# Patient Record
Sex: Female | Born: 1985 | Race: White | Hispanic: No | Marital: Married | Smoking: Never smoker
Health system: Southern US, Community
[De-identification: ages and names within clinical notes are randomized; demographics above are authoritative.]

## PROBLEM LIST (undated history)

## (undated) DIAGNOSIS — F32A Depression, unspecified: Secondary | ICD-10-CM

## (undated) DIAGNOSIS — R569 Unspecified convulsions: Secondary | ICD-10-CM

## (undated) DIAGNOSIS — G43909 Migraine, unspecified, not intractable, without status migrainosus: Secondary | ICD-10-CM

## (undated) DIAGNOSIS — F419 Anxiety disorder, unspecified: Secondary | ICD-10-CM

## (undated) DIAGNOSIS — E282 Polycystic ovarian syndrome: Secondary | ICD-10-CM

## (undated) DIAGNOSIS — N912 Amenorrhea, unspecified: Secondary | ICD-10-CM

## (undated) HISTORY — DX: Depression, unspecified: F32.A

## (undated) HISTORY — DX: Unspecified convulsions: R56.9

## (undated) HISTORY — DX: Migraine, unspecified, not intractable, without status migrainosus: G43.909

## (undated) HISTORY — DX: Polycystic ovarian syndrome: E28.2

## (undated) HISTORY — DX: Amenorrhea, unspecified: N91.2

## (undated) HISTORY — DX: Anxiety disorder, unspecified: F41.9

---

## 2017-04-06 ENCOUNTER — Ambulatory Visit (INDEPENDENT_AMBULATORY_CARE_PROVIDER_SITE_OTHER): Payer: 59 | Admitting: Family Medicine

## 2017-04-06 ENCOUNTER — Encounter: Payer: Self-pay | Admitting: Family Medicine

## 2017-04-06 VITALS — BP 128/85 | HR 63 | Temp 98.5°F | Ht 66.0 in | Wt 251.3 lb

## 2017-04-06 DIAGNOSIS — D489 Neoplasm of uncertain behavior, unspecified: Secondary | ICD-10-CM

## 2017-04-06 DIAGNOSIS — Z7689 Persons encountering health services in other specified circumstances: Secondary | ICD-10-CM

## 2017-04-06 DIAGNOSIS — D235 Other benign neoplasm of skin of trunk: Secondary | ICD-10-CM | POA: Diagnosis not present

## 2017-04-06 DIAGNOSIS — N912 Amenorrhea, unspecified: Secondary | ICD-10-CM | POA: Diagnosis not present

## 2017-04-06 NOTE — Progress Notes (Addendum)
BP 128/85 (BP Location: Right Arm, Patient Position: Sitting, Cuff Size: Large)   Pulse 63   Temp 98.5 F (36.9 C)   Ht 5\' 6"  (1.676 m)   Wt 251 lb 4.8 oz (114 kg)   SpO2 97%   BMI 40.56 kg/m    Subjective:    Patient ID: Ruth Bass, female    DOB: 1985-10-14, 31 y.o.   MRN: [id number]  HPI: Ruth Bass is a 31 y.o. female  Chief Complaint  Patient presents with  . Establish Care    No complaints   Patient presents today to establish care. Has several concerns. No known chronic medical conditions, and not currently on any medications.   Needs BMI exception form filled out for work. Has started counting calories and walking, down 7 lb in the past 2 months.   Has a mole on her upper back that she first noticed in middle school. No real changes noticed, bleeding, tenderness. Area is flesh toned.   Last period was over a year ago, will have one every few years. Started noticing in early 51s that they were becoming very infrequent. Periods were always irregular. No hx with birth control, no prior pregnancies. No noted abnormal hair growth, pelvic pain, dyspareunia. Has been to several doctors in her teens for this with no abnormal findings that she can remember.  Past Medical History:  Diagnosis Date  . Amenorrhea    Since child, periods severely irregular. Over time periods stopped.    Relevant past medical, surgical, family and social history reviewed and updated as indicated. Interim medical history since our last visit reviewed. Allergies and medications reviewed and updated.  Review of Systems  Constitutional: Negative.   HENT: Negative.   Respiratory: Negative.   Cardiovascular: Negative.   Gastrointestinal: Negative.   Genitourinary: Positive for menstrual problem.  Musculoskeletal: Negative.   Neurological: Negative.   Psychiatric/Behavioral: Negative.    Per HPI unless specifically indicated above     Objective:    BP 128/85 (BP Location: Right  Arm, Patient Position: Sitting, Cuff Size: Large)   Pulse 63   Temp 98.5 F (36.9 C)   Ht 5\' 6"  (1.676 m)   Wt 251 lb 4.8 oz (114 kg)   SpO2 97%   BMI 40.56 kg/m   Wt Readings from Last 3 Encounters:  04/06/17 251 lb 4.8 oz (114 kg)    Physical Exam  Constitutional: She is oriented to person, place, and time. She appears well-developed and well-nourished. No distress.  HENT:  Head: Atraumatic.  Eyes: Pupils are equal, round, and reactive to light. Conjunctivae are normal. No scleral icterus.  Neck: Normal range of motion. Neck supple.  Cardiovascular: Normal rate and normal heart sounds.   Pulmonary/Chest: Effort normal and breath sounds normal. No respiratory distress.  Musculoskeletal: Normal range of motion.  Neurological: She is alert and oriented to person, place, and time.  Skin: Skin is warm and dry.  Very small, flesh toned/white hard mass on mid upper back   Psychiatric: She has a normal mood and affect. Her behavior is normal.  Nursing note and vitals reviewed.  Procedure Note: Shave biopsy lesion upper back Procedure explained in full, pt questions answered adequately. Area was prepped with alcohol pad and infiltrated with 3 cc 1% lidocaine with epi. Dermablade was used to shave lesion, then gauze, pressure, and silver nitrate sticks used to control bleeding. Area was dressed with antibiotic ointment and bandage. Procedure well tolerated with no complications. Wound care reviewed  in detail. Return precautions discussed.   No results found for this or any previous visit.    Assessment & Plan:   Problem List Items Addressed This Visit    None    Visit Diagnoses    Encounter to establish care    -  Primary   Amenorrhea       Will refer to GYN for further workup. Offered to start OCPs to help induce/regulate periods in meantime but pt wishes to wait until work-up with GYN   Relevant Orders   Ambulatory referral to Gynecology   Neoplasm of uncertain behavior        Reassured pt that area was benign, but pt opting for bx for peace of mind. Bx performed, specimen sent for analysis. Wound care reviewed.    Relevant Orders   Pathology       Follow up plan: Return for CPE.

## 2017-04-06 NOTE — Patient Instructions (Signed)
Follow up as needed

## 2017-04-09 ENCOUNTER — Telehealth: Payer: Self-pay | Admitting: Obstetrics & Gynecology

## 2017-04-09 NOTE — Telephone Encounter (Signed)
[facility] family referred for Amenorrhea. Voicemail box not setup unable to leave message

## 2017-04-10 NOTE — Telephone Encounter (Signed)
Attempted to reach patient voicemail box not set up.

## 2017-04-16 ENCOUNTER — Telehealth: Payer: Self-pay | Admitting: Family Medicine

## 2017-04-16 LAB — PATHOLOGY

## 2017-04-16 NOTE — Telephone Encounter (Signed)
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 Nothing to worry about whatsoever!

## 2017-04-16 NOTE — Telephone Encounter (Signed)
Patient notified

## 2017-04-17 NOTE — Telephone Encounter (Signed)
Attempted to reach patient voicemail box not set up.

## 2017-04-19 NOTE — Telephone Encounter (Signed)
Attempted to reach patient voicemail box not set up.

## 2017-07-19 ENCOUNTER — Ambulatory Visit: Payer: 59 | Admitting: Family Medicine

## 2017-07-25 ENCOUNTER — Other Ambulatory Visit: Payer: Self-pay

## 2017-07-25 ENCOUNTER — Ambulatory Visit: Payer: Managed Care, Other (non HMO) | Admitting: Family Medicine

## 2017-07-25 VITALS — BP 141/88 | HR 59 | Temp 98.3°F | Resp 15 | Wt 233.0 lb

## 2017-07-25 DIAGNOSIS — R5383 Other fatigue: Secondary | ICD-10-CM

## 2017-07-25 DIAGNOSIS — M791 Myalgia, unspecified site: Secondary | ICD-10-CM | POA: Diagnosis not present

## 2017-07-25 DIAGNOSIS — L509 Urticaria, unspecified: Secondary | ICD-10-CM

## 2017-07-25 MED ORDER — DULOXETINE HCL 20 MG PO CPEP: 20.0000 mg | ORAL_CAPSULE | Freq: Every day | ORAL | 0 refills | Status: DC

## 2017-07-25 MED ORDER — TRIAMCINOLONE ACETONIDE 0.1 % EX CREA
1.0000 | TOPICAL_CREAM | Freq: Two times a day (BID) | CUTANEOUS | 0 refills | Status: DC
Start: 2017-07-25 — End: 2017-09-19

## 2017-07-25 MED ORDER — CYCLOBENZAPRINE HCL 10 MG PO TABS: 10.0000 mg | ORAL_TABLET | Freq: Three times a day (TID) | ORAL | 0 refills | Status: DC | PRN

## 2017-07-25 NOTE — Progress Notes (Signed)
BP (!) 141/88 (BP Location: Right Arm, Patient Position: Sitting)   Pulse (!) 59   Temp 98.3 F (36.8 C) (Oral)   Resp 15   Wt 233 lb (105.7 kg)   SpO2 98%   BMI 37.61 kg/m    Subjective:    Patient ID: Ruth Bass, female    DOB: 08-Mar-1986, 32 y.o.   MRN: [id number]  HPI: Ruth Bass is a 32 y.o. female  Chief Complaint  Patient presents with  . Fatigue  . Muscle Pain    bilateral arms and legs   . Anxiety  . Altered Mental Status   Several years of muscle aches in extremities, comes and goes throughout the year. Current flare has been several weeks now. Also having pains less often in sides around ribs. Sometimes having hand weakness associated. Has mental fog, dysphoria, and anxiety associated with it. Has family members with fibromyalgia and states this seems similar to what they are dealing with. Denies hx of trauma or injuries to these areas. Has not been trying anything OTC other than occasional OTC pain relievers with some relief.   Itchy rash coming and going on left forearm the past few weeks. No new exposures, recent travel, pets in the home. Has not tried anything for relief.   Past Medical History:  Diagnosis Date  . Amenorrhea    Since child, periods severely irregular. Over time periods stopped.   Social History   Socioeconomic History  . Marital status: Significant Other    Spouse name: Not on file  . Number of children: Not on file  . Years of education: Not on file  . Highest education level: Not on file  Social Needs  . Financial resource strain: Not on file  . Food insecurity - worry: Not on file  . Food insecurity - inability: Not on file  . Transportation needs - medical: Not on file  . Transportation needs - non-medical: Not on file  Occupational History  . Not on file  Tobacco Use  . Smoking status: Never Smoker  . Smokeless tobacco: Never Used  Substance and Sexual Activity  . Alcohol use: No  . Drug use: No  . Sexual  activity: Not on file  Other Topics Concern  . Not on file  Social History Narrative  . Not on file   Relevant past medical, surgical, family and social history reviewed and updated as indicated. Interim medical history since our last visit reviewed. Allergies and medications reviewed and updated.  Review of Systems  HENT: Negative.   Eyes: Negative.   Respiratory: Negative.   Cardiovascular: Negative.   Gastrointestinal: Negative.   Genitourinary: Negative.   Musculoskeletal: Positive for myalgias.  Skin: Positive for rash.  Neurological: Positive for weakness.  Psychiatric/Behavioral: Positive for dysphoric mood. The patient is nervous/anxious.        "brain fog"   Per HPI unless specifically indicated above     Objective:    BP (!) 141/88 (BP Location: Right Arm, Patient Position: Sitting)   Pulse (!) 59   Temp 98.3 F (36.8 C) (Oral)   Resp 15   Wt 233 lb (105.7 kg)   SpO2 98%   BMI 37.61 kg/m   Wt Readings from Last 3 Encounters:  07/25/17 233 lb (105.7 kg)  04/06/17 251 lb 4.8 oz (114 kg)    Physical Exam  Constitutional: She is oriented to person, place, and time. She appears well-developed and well-nourished. No distress.  HENT:  Head: Atraumatic.  Eyes: Conjunctivae are normal. Pupils are equal, round, and reactive to light.  Neck: Normal range of motion. Neck supple.  Cardiovascular: Normal rate, normal heart sounds and intact distal pulses.  Pulmonary/Chest: Effort normal and breath sounds normal. No respiratory distress.  Musculoskeletal: Normal range of motion. She exhibits tenderness (Mild ttp over deltoid b/l). She exhibits no edema or deformity.  Grip strength equal b/l hands  Neurological: She is alert and oriented to person, place, and time. No cranial nerve deficit. She exhibits normal muscle tone. Coordination normal.  Skin: Skin is warm and dry. Rash (Mild urticarial patch left forearm) noted.  Psychiatric: She has a normal mood and affect. Her  behavior is normal.  Nursing note and vitals reviewed.  Results for orders placed or performed in visit on 07/25/17  VITAMIN D 25 Hydroxy (Vit-D Deficiency, Fractures)  Result Value Ref Range   Vit D, 25-Hydroxy 24.2 (L) 30.0 - 100.0 ng/mL  Vitamin B12  Result Value Ref Range   Vitamin B-12 425 232 - 1,245 pg/mL      Assessment & Plan:   Problem List Items Addressed This Visit    None    Visit Diagnoses    Fatigue, unspecified type    -  Primary   Relevant Orders   VITAMIN D 25 Hydroxy (Vit-D Deficiency, Fractures) (Completed)   Vitamin B12 (Completed)   Generalized muscle ache       Sxs consistent with fibromyalgia, will trial cymbalta to target moods and pain sxs. Risks and benefits reviewed. Epsom soaks, muscle rubs, tylenol prn   Urticaria       Benadryl and tirmacinolone cream prn. Good moisturizing regimen recommended       Follow up plan: Return in about 4 weeks (around 08/22/2017) for Medication f/u.

## 2017-07-25 NOTE — Patient Instructions (Signed)
[facility] [phone]

## 2017-07-26 LAB — VITAMIN B12: Vitamin B-12: 425 pg/mL (ref 232–1245)

## 2017-07-26 LAB — VITAMIN D 25 HYDROXY (VIT D DEFICIENCY, FRACTURES): Vit D, 25-Hydroxy: 24.2 ng/mL — ABNORMAL LOW (ref 30.0–100.0)

## 2017-07-27 ENCOUNTER — Telehealth: Payer: Self-pay | Admitting: Family Medicine

## 2017-07-27 NOTE — Telephone Encounter (Signed)
Spoke with patient, recommended OTC vitamin D and increasing dietary intake. Pt wanting to know if a certain brand was best, discussed that any reputable brand is fine. Pt denies any further questions

## 2017-07-27 NOTE — Telephone Encounter (Signed)
Copied from [place] [id number]. Topic: Quick Communication - See Telephone Encounter >> Jul 27, 2017  2:35 PM Arletha Grippe wrote: CRM for notification. See Telephone encounter for:   07/27/17. Pt received labs on mychart about her vit d and has questions. Please call [phone]

## 2017-07-28 ENCOUNTER — Encounter: Payer: Self-pay | Admitting: Family Medicine

## 2017-08-01 ENCOUNTER — Encounter: Payer: Self-pay | Admitting: Family Medicine

## 2017-08-10 ENCOUNTER — Encounter: Payer: Self-pay | Admitting: Family Medicine

## 2017-08-13 ENCOUNTER — Other Ambulatory Visit: Payer: Self-pay | Admitting: Family Medicine

## 2017-08-13 DIAGNOSIS — N912 Amenorrhea, unspecified: Secondary | ICD-10-CM

## 2017-08-16 ENCOUNTER — Encounter: Payer: Self-pay | Admitting: Obstetrics and Gynecology

## 2017-08-24 ENCOUNTER — Other Ambulatory Visit: Payer: Self-pay | Admitting: Obstetrics & Gynecology

## 2017-08-24 ENCOUNTER — Ambulatory Visit: Payer: Managed Care, Other (non HMO) | Admitting: Obstetrics & Gynecology

## 2017-08-24 ENCOUNTER — Encounter: Payer: Self-pay | Admitting: Obstetrics & Gynecology

## 2017-08-24 ENCOUNTER — Ambulatory Visit: Payer: Managed Care, Other (non HMO) | Admitting: Family Medicine

## 2017-08-24 ENCOUNTER — Encounter: Payer: Self-pay | Admitting: Family Medicine

## 2017-08-24 VITALS — BP 125/83 | HR 73 | Temp 97.6°F | Wt 227.6 lb

## 2017-08-24 VITALS — BP 140/90 | HR 77 | Ht 66.0 in | Wt 226.0 lb

## 2017-08-24 DIAGNOSIS — F339 Major depressive disorder, recurrent, unspecified: Secondary | ICD-10-CM | POA: Insufficient documentation

## 2017-08-24 DIAGNOSIS — M791 Myalgia, unspecified site: Secondary | ICD-10-CM | POA: Diagnosis not present

## 2017-08-24 DIAGNOSIS — Z124 Encounter for screening for malignant neoplasm of cervix: Secondary | ICD-10-CM | POA: Diagnosis not present

## 2017-08-24 DIAGNOSIS — N912 Amenorrhea, unspecified: Secondary | ICD-10-CM

## 2017-08-24 MED ORDER — MEDROXYPROGESTERONE ACETATE 10 MG PO TABS: ORAL_TABLET | ORAL | 0 refills | Status: DC

## 2017-08-24 MED ORDER — DULOXETINE HCL 30 MG PO CPEP: 30.0000 mg | ORAL_CAPSULE | Freq: Every day | ORAL | 2 refills | Status: DC

## 2017-08-24 NOTE — Assessment & Plan Note (Signed)
Good preliminary improvement on Cymbalta, will increase to 30 mg and monitor closely for benefit.

## 2017-08-24 NOTE — Patient Instructions (Signed)
Medroxyprogesterone tablets What is this medicine? MEDROXYPROGESTERONE (me DROX ee proe JES te rone) is a hormone in a class called progestins. It is commonly used to prevent the uterine lining from overgrowth in women taking an estrogen after menopause. It is also used to treat irregular menstrual bleeding or a lack of menstrual bleeding in women. This medicine may be used for other purposes; ask your health care provider or pharmacist if you have questions. COMMON BRAND NAME(S): Amen, Provera What should I tell my health care provider before I take this medicine? They need to know if you have any of these conditions: -blood vessel disease or a history of a blood clot in the lungs or legs -breast, cervical or vaginal cancer -heart disease -kidney disease -liver disease -migraine -recent miscarriage or abortion -mental depression -migraine -seizures (convulsions) -stroke -vaginal bleeding that has not been evaluated -an unusual or allergic reaction to medroxyprogesterone, other medicines, foods, dyes, or preservatives -pregnant or trying to get pregnant -breast-feeding How should I use this medicine? Take this medicine by mouth with a glass of water. Follow the directions on the prescription label. Take your doses at regular intervals. Do not take your medicine more often than directed. Talk to your pediatrician regarding the use of this medicine in children. Special care may be needed. While this drug may be prescribed for children as young as 13 years for selected conditions, precautions do apply. Overdosage: If you think you have taken too much of this medicine contact a poison control center or emergency room at once. NOTE: This medicine is only for you. Do not share this medicine with others. What if I miss a dose? If you miss a dose, take it as soon as you can. If it is almost time for your next dose, take only that dose. Do not take double or extra doses. What may interact with  this medicine? -barbiturate medicines for inducing sleep or treating seizures (convulsions) -bosentan -carbamazepine -phenytoin -rifampin -St. John's Wort This list may not describe all possible interactions. Give your health care provider a list of all the medicines, herbs, non-prescription drugs, or dietary supplements you use. Also tell them if you smoke, drink alcohol, or use illegal drugs. Some items may interact with your medicine. What should I watch for while using this medicine? Visit your health care professional for regular checks on your progress. You will need a regular breast and pelvic exam. If you have any reason to think you are pregnant, stop taking this medicine at once and contact your doctor or health care professional. What side effects may I notice from receiving this medicine? Side effects that you should report to your doctor or health care professional as soon as possible: -breast tenderness or discharge -changes in mood or emotions, such as depression -changes in vision or speech -pain in the abdomen, chest, groin, or leg -severe headache -skin rash, itching, or hives -sudden shortness of breath -unusually weak or tired -yellowing of skin or eyes Side effects that usually do not require medical attention (report to your doctor or health care professional if they continue or are bothersome): -acne -change in menstrual bleeding pattern or flow -changes in sexual desire -facial hair growth -fluid retention and swelling -headache -upset stomach -weight gain or loss This list may not describe all possible side effects. Call your doctor for medical advice about side effects. You may report side effects to FDA at 1-800-FDA-1088. Where should I keep my medicine? Keep out of the reach of children.  Store at room temperature between 20 and 25 degrees C (68 and 77 degrees F). Throw away any unused medicine after the expiration date. NOTE: This sheet is a summary. It  may not cover all possible information. If you have questions about this medicine, talk to your doctor, pharmacist, or health care provider.  2018 Elsevier/Gold Standard (2008-06-25 11:26:12)

## 2017-08-24 NOTE — Progress Notes (Signed)
Amenorrhea Patient presents for evaluation of amenorrhea. Currently periods are occurring every absent since 05/2016 and prior to that were 2 times per year really since menarche age 32. Bleeding is none now.  Is there a chance of pregnancy? She is sexually active without birth control for 2 years.  Factors that may be contributory to menstrual abnormalities include: hormonal factors as always has had oligomenorrhea, concern for PCOS. Previous treatments for menstrual abnormalities include: never took OCPs or BC.  PMHx: She  has a past medical history of Amenorrhea. Also,  has no past surgical history on file., family history includes Cancer in her maternal grandmother and paternal aunt; Diabetes in her father and paternal uncle; Heart disease in her father and paternal aunt; Hypertension in her mother; Irritable bowel syndrome in her cousin; Mental illness in her sister; Pancreatitis in her father; Stroke in her maternal grandfather and maternal grandmother.,  reports that  has never smoked. she has never used smokeless tobacco. She reports that she does not drink alcohol or use drugs.  She has a current medication list which includes the following prescription(s): cyclobenzaprine, duloxetine, famotidine, multivitamin, acetaminophen, and triamcinolone cream. Also, has No Known Allergies.  Review of Systems  Constitutional: Negative for chills, fever and malaise/fatigue.  HENT: Negative for congestion, sinus pain and sore throat.   Eyes: Negative for blurred vision and pain.  Respiratory: Negative for cough and wheezing.   Cardiovascular: Negative for chest pain and leg swelling.  Gastrointestinal: Negative for abdominal pain, constipation, diarrhea, heartburn, nausea and vomiting.  Genitourinary: Negative for dysuria, frequency, hematuria and urgency.  Musculoskeletal: Negative for back pain, joint pain, myalgias and neck pain.  Skin: Negative for itching and rash.  Neurological: Negative for  dizziness, tremors and weakness.  Endo/Heme/Allergies: Does not bruise/bleed easily.  Psychiatric/Behavioral: Negative for depression. The patient is not nervous/anxious and does not have insomnia.     Objective: BP 140/90   Pulse 77   Ht 5\' 6"  (1.676 m)   Wt 226 lb (102.5 kg)   BMI 36.48 kg/m  Physical Exam  Constitutional: She is oriented to person, place, and time. She appears well-developed and well-nourished. No distress.  Genitourinary: Rectum normal, vagina normal and uterus normal. Pelvic exam was performed with patient supine. There is no rash or lesion on the right labia. There is no rash or lesion on the left labia. Vagina exhibits no lesion. No bleeding in the vagina. Right adnexum does not display mass and does not display tenderness. Left adnexum does not display mass and does not display tenderness. Cervix does not exhibit motion tenderness, lesion, friability or polyp.   Uterus is mobile and midaxial. Uterus is not enlarged or exhibiting a mass.  HENT:  Head: Normocephalic and atraumatic. Head is without laceration.  Right Ear: Hearing normal.  Left Ear: Hearing normal.  Nose: No epistaxis.  No foreign bodies.  Mouth/Throat: Uvula is midline, oropharynx is clear and moist and mucous membranes are normal.  Eyes: Pupils are equal, round, and reactive to light.  Neck: Normal range of motion. Neck supple. No thyromegaly present.  Cardiovascular: Normal rate and regular rhythm. Exam reveals no gallop and no friction rub.  No murmur heard. Pulmonary/Chest: Effort normal and breath sounds normal. No respiratory distress. She has no wheezes. Right breast exhibits no mass, no skin change and no tenderness. Left breast exhibits no mass, no skin change and no tenderness.  Abdominal: Soft. Bowel sounds are normal. She exhibits no distension. There is no tenderness. There is  no rebound.  Musculoskeletal: Normal range of motion.  Neurological: She is alert and oriented to person,  place, and time. No cranial nerve deficit.  Skin: Skin is warm and dry.  Psychiatric: She has a normal mood and affect. Judgment normal.  Vitals reviewed.   ASSESSMENT/PLAN:   Problem List Items Addressed This Visit    None    Visit Diagnoses    Amenorrhea    -  Primary   Relevant Orders   FSH/LH   DHEA-sulfate   Prolactin   Testosterone, Free, Total, SHBG   TSH   Estradiol   Beta HCG, Quant   Screening for cervical cancer       Relevant Orders   IGP, Aptima HPV    Provera challenge today as well Pt desires OCP for BC as well as mgt for likely PCOS; will see labs first before prescribing.  Will call w results.  PAP today as has never had one  Barnett Applebaum, MD, [facility], [facility] 08/24/2017  11:16 AM

## 2017-08-24 NOTE — Progress Notes (Signed)
   BP 125/83 (BP Location: Left Arm, Patient Position: Sitting, Cuff Size: Normal)   Pulse 73   Temp 97.6 F (36.4 C) (Oral)   Wt 227 lb 9.6 oz (103.2 kg)   SpO2 98%   BMI 36.74 kg/m    Subjective:    Patient ID: Ruth Bass, female    DOB: 01-May-1986, 32 y.o.   MRN: [id number]  HPI: Ruth Bass is a 32 y.o. female  Chief Complaint  Patient presents with  . Depression    1 month Medication F/U. Patient states cymbalta has helped some, but she understood it could take a month or so to fully get in her system.  . Nerve Pain  . Medication Management   Pt here today for mood and myalgia f/u. Doing very well on cymbalta after 1 month. Noticing definite benefit with her nerve pains/myalgias and starting to note some effect with her moods and fatigue. Denies any side effects with the medication. Denies SI/HI, hallucinations, manic behaviors. PHQ-9 score today of 8.   No flowsheet data found.  Relevant past medical, surgical, family and social history reviewed and updated as indicated. Interim medical history since our last visit reviewed. Allergies and medications reviewed and updated.  Review of Systems  Per HPI unless specifically indicated above     Objective:    BP 125/83 (BP Location: Left Arm, Patient Position: Sitting, Cuff Size: Normal)   Pulse 73   Temp 97.6 F (36.4 C) (Oral)   Wt 227 lb 9.6 oz (103.2 kg)   SpO2 98%   BMI 36.74 kg/m   Wt Readings from Last 3 Encounters:  08/24/17 227 lb 9.6 oz (103.2 kg)  08/24/17 226 lb (102.5 kg)  07/25/17 233 lb (105.7 kg)    Physical Exam  Constitutional: She is oriented to person, place, and time. She appears well-developed and well-nourished. No distress.  HENT:  Head: Atraumatic.  Eyes: Conjunctivae are normal. Pupils are equal, round, and reactive to light. No scleral icterus.  Neck: Normal range of motion. Neck supple.  Cardiovascular: Normal rate, regular rhythm and normal heart sounds.  Pulmonary/Chest:  Effort normal and breath sounds normal. No respiratory distress.  Musculoskeletal: Normal range of motion.  Neurological: She is alert and oriented to person, place, and time. No cranial nerve deficit.  Skin: Skin is warm and dry.  Psychiatric: She has a normal mood and affect. Her behavior is normal. Thought content normal.  Nursing note and vitals reviewed.   Results for orders placed or performed in visit on 07/25/17  VITAMIN D 25 Hydroxy (Vit-D Deficiency, Fractures)  Result Value Ref Range   Vit D, 25-Hydroxy 24.2 (L) 30.0 - 100.0 ng/mL  Vitamin B12  Result Value Ref Range   Vitamin B-12 425 232 - 1,245 pg/mL      Assessment & Plan:   Problem List Items Addressed This Visit      Other   Depression, recurrent (Ruth Bass) - Primary    Good preliminary improvement on Cymbalta, will increase to 30 mg and monitor closely for benefit.       Relevant Medications   DULoxetine (CYMBALTA) 30 MG capsule    Other Visit Diagnoses    Generalized muscle ache       Noting significant benefit from cymablta. Will increase dose and continue to monitor.        Follow up plan: Return in about 3 months (around 11/21/2017) for Mood f/u.

## 2017-08-26 LAB — TESTOSTERONE, FREE, TOTAL, SHBG
Sex Hormone Binding: 23.3 nmol/L — ABNORMAL LOW (ref 24.6–122.0)
TESTOSTERONE: 70 ng/dL — AB (ref 8–48)
Testosterone, Free: 7.9 pg/mL — ABNORMAL HIGH (ref 0.0–4.2)

## 2017-08-26 LAB — ESTRADIOL: ESTRADIOL: 33.6 pg/mL

## 2017-08-26 LAB — TSH: TSH: 3.21 u[IU]/mL (ref 0.450–4.500)

## 2017-08-26 LAB — DHEA-SULFATE: DHEA-SO4: 397.9 ug/dL — ABNORMAL HIGH (ref 84.8–378.0)

## 2017-08-26 LAB — FSH/LH
FSH: 6 m[IU]/mL
LH: 12.7 m[IU]/mL

## 2017-08-26 LAB — BETA HCG QUANT (REF LAB): hCG Quant: <1 m[IU]/mL

## 2017-08-26 LAB — PROLACTIN: PROLACTIN: 7.5 ng/mL (ref 4.8–23.3)

## 2017-08-27 NOTE — Patient Instructions (Signed)
Follow up in 3 months

## 2017-08-29 LAB — IGP, APTIMA HPV
HPV APTIMA: NEGATIVE
PAP Smear Comment: 0

## 2017-08-30 ENCOUNTER — Encounter: Payer: Self-pay | Admitting: Obstetrics & Gynecology

## 2017-08-30 NOTE — Progress Notes (Signed)
I will call on Feb 27 to see how Provera challenge went.  Labs c/w PCOS.  OCP a good option for now.  Will discuss and Rx at that time if agreeable.

## 2017-09-07 ENCOUNTER — Telehealth: Payer: Self-pay

## 2017-09-07 NOTE — Telephone Encounter (Signed)
Pt received vm from [facility] to update him on medication he gave her to induce a period.  Pt states she started her period yesterday.  [phone]

## 2017-09-11 ENCOUNTER — Other Ambulatory Visit: Payer: Self-pay | Admitting: Obstetrics & Gynecology

## 2017-09-11 MED ORDER — NORETHIN ACE-ETH ESTRAD-FE 1-20 MG-MCG PO TABS
1.0000 | ORAL_TABLET | Freq: Every day | ORAL | 11 refills | Status: DC
Start: 2017-09-11 — End: 2017-09-12

## 2017-09-12 ENCOUNTER — Encounter: Payer: Self-pay | Admitting: Obstetrics & Gynecology

## 2017-09-12 ENCOUNTER — Other Ambulatory Visit: Payer: Self-pay

## 2017-09-12 MED ORDER — NORETHIN ACE-ETH ESTRAD-FE 1-20 MG-MCG PO TABS: 1.0000 | ORAL_TABLET | Freq: Every day | ORAL | 11 refills | Status: DC

## 2017-09-12 NOTE — Telephone Encounter (Signed)
Did you ever speak with pt about her period? Or is there anything I can advise her to do?

## 2017-09-12 NOTE — Telephone Encounter (Signed)
Yes, I spoke to her. She is on OCP now

## 2017-09-19 ENCOUNTER — Encounter: Payer: Self-pay | Admitting: Family Medicine

## 2017-09-19 ENCOUNTER — Ambulatory Visit (INDEPENDENT_AMBULATORY_CARE_PROVIDER_SITE_OTHER): Payer: Managed Care, Other (non HMO) | Admitting: Family Medicine

## 2017-09-19 VITALS — BP 134/85 | HR 80 | Temp 98.5°F | Ht 65.5 in | Wt 222.6 lb

## 2017-09-19 DIAGNOSIS — Z113 Encounter for screening for infections with a predominantly sexual mode of transmission: Secondary | ICD-10-CM

## 2017-09-19 DIAGNOSIS — Z0000 Encounter for general adult medical examination without abnormal findings: Secondary | ICD-10-CM | POA: Diagnosis not present

## 2017-09-19 DIAGNOSIS — Z23 Encounter for immunization: Secondary | ICD-10-CM | POA: Diagnosis not present

## 2017-09-19 DIAGNOSIS — Z114 Encounter for screening for human immunodeficiency virus [HIV]: Secondary | ICD-10-CM | POA: Diagnosis not present

## 2017-09-19 LAB — UA/M W/RFLX CULTURE, ROUTINE
BILIRUBIN UA: NEGATIVE
Leukocytes, UA: NEGATIVE
Nitrite, UA: NEGATIVE
PH UA: 6 (ref 5.0–7.5)
RBC UA: NEGATIVE
Specific Gravity, UA: >1.03 — ABNORMAL HIGH (ref 1.005–1.030)
UUROB: 1 mg/dL (ref 0.2–1.0)

## 2017-09-19 LAB — MICROSCOPIC EXAMINATION: Bacteria, UA: NONE SEEN

## 2017-09-19 NOTE — Patient Instructions (Addendum)
Td Vaccine (Tetanus and Diphtheria): What You Need to Know 1. Why get vaccinated? Tetanus  and diphtheria are very serious diseases. They are rare in the [place] today, but people who do become infected often have severe complications. Td vaccine is used to protect adolescents and adults from both of these diseases. Both tetanus and diphtheria are infections caused by bacteria. Diphtheria spreads from person to person through coughing or sneezing. Tetanus-causing bacteria enter the body through cuts, scratches, or wounds. TETANUS (lockjaw) causes painful muscle tightening and stiffness, usually all over the body.  It can lead to tightening of muscles in the head and neck so you can't open your mouth, swallow, or sometimes even breathe. Tetanus kills about 1 out of every 10 people who are infected even after receiving the best medical care.  DIPHTHERIA can cause a thick coating to form in the back of the throat.  It can lead to breathing problems, paralysis, heart failure, and death.  Before vaccines, as many as 200,000 cases of diphtheria and hundreds of cases of tetanus were reported in the [place] each year. Since vaccination began, reports of cases for both diseases have dropped by about 99%. 2. Td vaccine Td vaccine can protect adolescents and adults from tetanus and diphtheria. Td is usually given as a booster dose every 10 years but it can also be given earlier after a severe and dirty wound or burn. Another vaccine, called Tdap, which protects against pertussis in addition to tetanus and diphtheria, is sometimes recommended instead of Td vaccine. Your doctor or the person giving you the vaccine can give you more information. Td may safely be given at the same time as other vaccines. 3. Some people should not get this vaccine  A person who has ever had a life-threatening allergic reaction after a previous dose of any tetanus or diphtheria containing vaccine, OR has a severe  allergy to any part of this vaccine, should not get Td vaccine. Tell the person giving the vaccine about any severe allergies.  Talk to your doctor if you: ? had severe pain or swelling after any vaccine containing diphtheria or tetanus, ? ever had a condition called Guillain Barre Syndrome (GBS), ? aren't feeling well on the day the shot is scheduled. 4. What are the risks from Td vaccine? With any medicine, including vaccines, there is a chance of side effects. These are usually mild and go away on their own. Serious reactions are also possible but are rare. Most people who get Td vaccine do not have any problems with it. Mild problems following Td vaccine: (Did not interfere with activities)  Pain where the shot was given (about 8 people in 10)  Redness or swelling where the shot was given (about 1 person in 4)  Mild fever (rare)  Headache (about 1 person in 4)  Tiredness (about 1 person in 4)  Moderate problems following Td vaccine: (Interfered with activities, but did not require medical attention)  Fever over 102F (rare)  Severe problems following Td vaccine: (Unable to perform usual activities; required medical attention)  Swelling, severe pain, bleeding and/or redness in the arm where the shot was given (rare).  Problems that could happen after any vaccine:  People sometimes faint after a medical procedure, including vaccination. Sitting or lying down for about 15 minutes can help prevent fainting, and injuries caused by a fall. Tell your doctor if you feel dizzy, or have vision changes or ringing in the ears.  Some people get   severe pain in the shoulder and have difficulty moving the arm where a shot was given. This happens very rarely.  Any medication can cause a severe allergic reaction. Such reactions from a vaccine are very rare, estimated at fewer than 1 in a million doses, and would happen within a few minutes to a few hours after the vaccination. As with any  medicine, there is a very remote chance of a vaccine causing a serious injury or death. The safety of vaccines is always being monitored. For more information, visit: www.cdc.gov/vaccinesafety/ 5. What if there is a serious reaction? What should I look for? Look for anything that concerns you, such as signs of a severe allergic reaction, very high fever, or unusual behavior. Signs of a severe allergic reaction can include hives, swelling of the face and throat, difficulty breathing, a fast heartbeat, dizziness, and weakness. These would usually start a few minutes to a few hours after the vaccination. What should I do?  If you think it is a severe allergic reaction or other emergency that can't wait, call 9-1-1 or get the person to the nearest hospital. Otherwise, call your doctor.  Afterward, the reaction should be reported to the Vaccine Adverse Event Reporting System (VAERS). Your doctor might file this report, or you can do it yourself through the VAERS web site at www.vaers.hhs.gov, or by calling 1-800-822-7967. ? VAERS does not give medical advice. 6. The National Vaccine Injury Compensation Program The National Vaccine Injury Compensation Program (VICP) is a federal program that was created to compensate people who may have been injured by certain vaccines. Persons who believe they may have been injured by a vaccine can learn about the program and about filing a claim by calling 1-800-338-2382 or visiting the VICP website at www.hrsa.gov/vaccinecompensation. There is a time limit to file a claim for compensation. 7. How can I learn more?  Ask your doctor. He or she can give you the vaccine package insert or suggest other sources of information.  Call your local or state health department.  Contact the Centers for Disease Control and Prevention (CDC): ? Call 1-800-232-4636 (1-800-CDC-INFO) ? Visit CDC's website at www.cdc.gov/vaccines CDC Td Vaccine VIS (10/19/15) This information is  not intended to replace advice given to you by your health care provider. Make sure you discuss any questions you have with your health care provider. Document Released: 04/23/2006 Document Revised: 03/16/2016 Document Reviewed: 03/16/2016 Elsevier Interactive Patient Education  2017 [employer] Inc.  

## 2017-09-19 NOTE — Progress Notes (Signed)
BP 134/85 (BP Location: Left Arm, Patient Position: Sitting, Cuff Size: Normal)   Pulse 80   Temp 98.5 F (36.9 C) (Oral)   Ht 5' 5.5" (1.664 m)   Wt 222 lb 9.6 oz (101 kg)   SpO2 97%   BMI 36.48 kg/m    Subjective:    Patient ID: Ruth Bass, female    DOB: 08-19-1985, 32 y.o.   MRN: [id number]  HPI: Ruth Bass is a 32 y.o. female presenting on 09/19/2017 for comprehensive medical examination. Current medical complaints include:none  Fasting for labs today.   Depression Screen done today and results listed below:  Depression screen [facility] 2/9 09/19/2017  Decreased Interest 2  Down, Depressed, Hopeless 1  PHQ - 2 Score 3  Altered sleeping 3  Tired, decreased energy 3  Change in appetite 1  Feeling bad or failure about yourself  0  Trouble concentrating 1  Moving slowly or fidgety/restless 1  Suicidal thoughts 0  PHQ-9 Score 12    The patient does not have a history of falls. I did not complete a risk assessment for falls. A plan of care for falls was not documented.   Past Medical History:  Past Medical History:  Diagnosis Date  . Amenorrhea    Since child, periods severely irregular. Over time periods stopped.    Surgical History:  No past surgical history on file.  Medications:  Current Outpatient Medications on File Prior to Visit  Medication Sig  . acetaminophen (TYLENOL) 325 MG tablet Take 650 mg by mouth every 6 (six) hours as needed.  . cyclobenzaprine (FLEXERIL) 10 MG tablet Take 1 tablet (10 mg total) by mouth 3 (three) times daily as needed for muscle spasms.  . DULoxetine (CYMBALTA) 30 MG capsule Take 1 capsule (30 mg total) by mouth daily.  . famotidine (PEPCID) 10 MG tablet Take 10 mg by mouth 2 (two) times daily.  . Multiple Vitamin (MULTIVITAMIN) capsule Take 1 capsule by mouth daily.  . norethindrone-ethinyl estradiol (JUNEL FE 1/20) 1-20 MG-MCG tablet Take 1 tablet by mouth daily.   No current facility-administered medications on file  prior to visit.     Allergies:  No Known Allergies  Social History:  Social History   Socioeconomic History  . Marital status: Significant Other    Spouse name: Not on file  . Number of children: Not on file  . Years of education: Not on file  . Highest education level: Not on file  Social Needs  . Financial resource strain: Not on file  . Food insecurity - worry: Not on file  . Food insecurity - inability: Not on file  . Transportation needs - medical: Not on file  . Transportation needs - non-medical: Not on file  Occupational History  . Not on file  Tobacco Use  . Smoking status: Never Smoker  . Smokeless tobacco: Never Used  Substance and Sexual Activity  . Alcohol use: No  . Drug use: No  . Sexual activity: Yes    Birth control/protection: None  Other Topics Concern  . Not on file  Social History Narrative  . Not on file   Social History   Tobacco Use  Smoking Status Never Smoker  Smokeless Tobacco Never Used   Social History   Substance and Sexual Activity  Alcohol Use No    Family History:  Family History  Problem Relation Age of Onset  . Hypertension Mother   . Diabetes Father   . Heart disease Father   .  Pancreatitis Father   . Mental illness Sister   . Heart disease Paternal Aunt   . Cancer Paternal Aunt        Breast  . Diabetes Paternal Uncle   . Cancer Maternal Grandmother        Brain Tumor and Colon Cancer  . Stroke Maternal Grandmother   . Stroke Maternal Grandfather   . Irritable bowel syndrome Cousin     Past medical history, surgical history, medications, allergies, family history and social history reviewed with patient today and changes made to appropriate areas of the chart.   Review of Systems - General ROS: negative Psychological ROS: negative Ophthalmic ROS: negative ENT ROS: negative Allergy and Immunology ROS: negative Breast ROS: negative for breast lumps Respiratory ROS: no cough, shortness of breath, or  wheezing Cardiovascular ROS: no chest pain or dyspnea on exertion Gastrointestinal ROS: no abdominal pain, change in bowel habits, or black or bloody stools Genito-Urinary ROS: no dysuria, trouble voiding, or hematuria Musculoskeletal ROS: negative Neurological ROS: no TIA or stroke symptoms Dermatological ROS: negative All other ROS negative except what is listed above and in the HPI.      Objective:    BP 134/85 (BP Location: Left Arm, Patient Position: Sitting, Cuff Size: Normal)   Pulse 80   Temp 98.5 F (36.9 C) (Oral)   Ht 5' 5.5" (1.664 m)   Wt 222 lb 9.6 oz (101 kg)   SpO2 97%   BMI 36.48 kg/m   Wt Readings from Last 3 Encounters:  09/19/17 222 lb 9.6 oz (101 kg)  08/24/17 227 lb 9.6 oz (103.2 kg)  08/24/17 226 lb (102.5 kg)    Physical Exam  Constitutional: She is oriented to person, place, and time. She appears well-developed and well-nourished. No distress.  HENT:  Head: Atraumatic.  Right Ear: External ear normal.  Left Ear: External ear normal.  Nose: Nose normal.  Mouth/Throat: Oropharynx is clear and moist. No oropharyngeal exudate.  Eyes: Conjunctivae are normal. Pupils are equal, round, and reactive to light. No scleral icterus.  Neck: Normal range of motion. Neck supple. No thyromegaly present.  Cardiovascular: Normal rate, regular rhythm, normal heart sounds and intact distal pulses.  Pulmonary/Chest: Effort normal and breath sounds normal. No respiratory distress. Right breast exhibits no nipple discharge, no skin change and no tenderness. Left breast exhibits no nipple discharge, no skin change and no tenderness. Breasts are symmetrical.  Diffuse fibrocystic tissue palpable b/l breasts, symmetric, no focal abnormalities  Abdominal: Soft. Bowel sounds are normal. She exhibits no mass. There is no tenderness.  Musculoskeletal: Normal range of motion. She exhibits no edema or tenderness.  Lymphadenopathy:    She has no cervical adenopathy.  Neurological:  She is alert and oriented to person, place, and time. No cranial nerve deficit.  Skin: Skin is warm and dry. No rash noted.  Psychiatric: She has a normal mood and affect. Her behavior is normal.  Nursing note and vitals reviewed.   Results for orders placed or performed in visit on 09/19/17  GC/Chlamydia Probe Amp  Result Value Ref Range   Chlamydia trachomatis, NAA Negative Negative   Neisseria gonorrhoeae by PCR Negative Negative  Microscopic Examination  Result Value Ref Range   WBC, UA 0-5 0 - 5 /hpf   RBC, UA 0-2 0 - 2 /hpf   Epithelial Cells (non renal) 0-10 0 - 10 /hpf   Casts Present None seen /lpf   Cast Type Hyaline casts N/A   Bacteria, UA None  seen None seen/Few  CBC with Differential/Platelet  Result Value Ref Range   WBC 6.3 3.4 - 10.8 x10E3/uL   RBC 4.31 3.77 - 5.28 x10E6/uL   Hemoglobin 13.5 11.1 - 15.9 g/dL   Hematocrit 41.1 34.0 - 46.6 %   MCV 95 79 - 97 fL   MCH 31.3 26.6 - 33.0 pg   MCHC 32.8 31.5 - 35.7 g/dL   RDW 12.2 (L) 12.3 - 15.4 %   Platelets 303 150 - 379 x10E3/uL   Neutrophils 55 Not Estab. %   Lymphs 35 Not Estab. %   Monocytes 6 Not Estab. %   Eos 3 Not Estab. %   Basos 1 Not Estab. %   Neutrophils Absolute 3.5 1.4 - 7.0 x10E3/uL   Lymphocytes Absolute 2.2 0.7 - 3.1 x10E3/uL   Monocytes Absolute 0.4 0.1 - 0.9 x10E3/uL   EOS (ABSOLUTE) 0.2 0.0 - 0.4 x10E3/uL   Basophils Absolute 0.1 0.0 - 0.2 x10E3/uL   Immature Granulocytes 0 Not Estab. %   Immature Grans (Abs) 0.0 0.0 - 0.1 x10E3/uL  Comprehensive metabolic panel  Result Value Ref Range   Glucose 82 65 - 99 mg/dL   BUN 9 6 - 20 mg/dL   Creatinine, Ser 0.68 0.57 - 1.00 mg/dL   GFR calc non Af Amer 117 >59 mL/min/1.73   GFR calc Af Amer 135 >59 mL/min/1.73   BUN/Creatinine Ratio 13 9 - 23   Sodium 140 134 - 144 mmol/L   Potassium 4.0 3.5 - 5.2 mmol/L   Chloride 103 96 - 106 mmol/L   CO2 21 20 - 29 mmol/L   Calcium 9.6 8.7 - 10.2 mg/dL   Total Protein 7.1 6.0 - 8.5 g/dL   Albumin  4.7 3.5 - 5.5 g/dL   Globulin, Total 2.4 1.5 - 4.5 g/dL   Albumin/Globulin Ratio 2.0 1.2 - 2.2   Bilirubin Total 0.6 0.0 - 1.2 mg/dL   Alkaline Phosphatase 49 39 - 117 IU/L   AST 41 (H) 0 - 40 IU/L   ALT 61 (H) 0 - 32 IU/L  Lipid Panel w/o Chol/HDL Ratio  Result Value Ref Range   Cholesterol, Total 149 100 - 199 mg/dL   Triglycerides 115 0 - 149 mg/dL   HDL 41 >39 mg/dL   VLDL Cholesterol Cal 23 5 - 40 mg/dL   LDL Calculated 85 0 - 99 mg/dL  TSH  Result Value Ref Range   TSH 2.390 0.450 - 4.500 uIU/mL  UA/M w/rflx Culture, Routine  Result Value Ref Range   Specific Gravity, UA >1.030 (H) 1.005 - 1.030   pH, UA 6.0 5.0 - 7.5   Color, UA Orange Yellow   Appearance Ur Hazy (A) Clear   Leukocytes, UA Negative Negative   Protein, UA 2+ (A) Negative/Trace   Glucose, UA Trace (A) Negative   Ketones, UA 2+ (A) Negative   RBC, UA Negative Negative   Bilirubin, UA Negative Negative   Urobilinogen, Ur 1.0 0.2 - 1.0 mg/dL   Nitrite, UA Negative Negative   Microscopic Examination See below:   HIV antibody  Result Value Ref Range   HIV Screen 4th Generation wRfx Non Reactive Non Reactive  RPR  Result Value Ref Range   RPR Ser Ql Non Reactive Non Reactive  HSV(herpes simplex vrs) 1+2 ab-IgG  Result Value Ref Range   HSV 1 Glycoprotein G Ab, IgG <0.91 0.00 - 0.90 index   HSV 2 IgG, Type Spec <0.91 0.00 - 0.90 index  Assessment & Plan:   Problem List Items Addressed This Visit    None    Visit Diagnoses    Annual physical exam    -  Primary   Relevant Orders   CBC with Differential/Platelet (Completed)   Comprehensive metabolic panel (Completed)   Lipid Panel w/o Chol/HDL Ratio (Completed)   TSH (Completed)   UA/M w/rflx Culture, Routine (Completed)   Encounter for screening for HIV       Relevant Orders   HIV antibody (Completed)   Routine screening for STI (sexually transmitted infection)       Relevant Orders   RPR (Completed)   HSV(herpes simplex vrs) 1+2 ab-IgG  (Completed)   GC/Chlamydia Probe Amp (Completed)       Follow up plan: Return in about 3 months (around 12/20/2017) for Mood, fibromyalgia f/u.   LABORATORY TESTING:  - Pap smear: up to date  IMMUNIZATIONS:   - Tdap: Tetanus vaccination status reviewed: Td vaccination indicated and given today. - Influenza: Refused  PATIENT COUNSELING:   Advised to take 1 mg of folate supplement per day if capable of pregnancy.   Sexuality: Discussed sexually transmitted diseases, partner selection, use of condoms, avoidance of unintended pregnancy  and contraceptive alternatives.   Advised to avoid cigarette smoking.  I discussed with the patient that most people either abstain from alcohol or drink within safe limits (<=14/week and <=4 drinks/occasion for males, <=7/weeks and <= 3 drinks/occasion for females) and that the risk for alcohol disorders and other health effects rises proportionally with the number of drinks per week and how often a drinker exceeds daily limits.  Discussed cessation/primary prevention of drug use and availability of treatment for abuse.   Diet: Encouraged to adjust caloric intake to maintain  or achieve ideal body weight, to reduce intake of dietary saturated fat and total fat, to limit sodium intake by avoiding high sodium foods and not adding table salt, and to maintain adequate dietary potassium and calcium preferably from fresh fruits, vegetables, and low-fat dairy products.    stressed the importance of regular exercise  Injury prevention: Discussed safety belts, safety helmets, smoke detector, smoking near bedding or upholstery.   Dental health: Discussed importance of regular tooth brushing, flossing, and dental visits.    NEXT PREVENTATIVE PHYSICAL DUE IN 1 YEAR. Return in about 3 months (around 12/20/2017) for Mood, fibromyalgia f/u.

## 2017-09-20 LAB — LIPID PANEL W/O CHOL/HDL RATIO
Cholesterol, Total: 149 mg/dL (ref 100–199)
HDL: 41 mg/dL (ref >39)
LDL CALC: 85 mg/dL (ref 0–99)
TRIGLYCERIDES: 115 mg/dL (ref 0–149)
VLDL Cholesterol Cal: 23 mg/dL (ref 5–40)

## 2017-09-20 LAB — COMPREHENSIVE METABOLIC PANEL
A/G RATIO: 2 (ref 1.2–2.2)
ALT: 61 IU/L — ABNORMAL HIGH (ref 0–32)
AST: 41 IU/L — AB (ref 0–40)
Albumin: 4.7 g/dL (ref 3.5–5.5)
Alkaline Phosphatase: 49 IU/L (ref 39–117)
BUN/Creatinine Ratio: 13 (ref 9–23)
BUN: 9 mg/dL (ref 6–20)
Bilirubin Total: 0.6 mg/dL (ref 0.0–1.2)
CALCIUM: 9.6 mg/dL (ref 8.7–10.2)
CO2: 21 mmol/L (ref 20–29)
CREATININE: 0.68 mg/dL (ref 0.57–1.00)
Chloride: 103 mmol/L (ref 96–106)
GFR, EST AFRICAN AMERICAN: 135 mL/min/{1.73_m2} (ref >59)
GFR, EST NON AFRICAN AMERICAN: 117 mL/min/{1.73_m2} (ref >59)
Globulin, Total: 2.4 g/dL (ref 1.5–4.5)
Glucose: 82 mg/dL (ref 65–99)
POTASSIUM: 4 mmol/L (ref 3.5–5.2)
Sodium: 140 mmol/L (ref 134–144)
TOTAL PROTEIN: 7.1 g/dL (ref 6.0–8.5)

## 2017-09-20 LAB — CBC WITH DIFFERENTIAL/PLATELET
Basophils Absolute: 0.1 10*3/uL (ref 0.0–0.2)
Basos: 1 %
EOS (ABSOLUTE): 0.2 10*3/uL (ref 0.0–0.4)
EOS: 3 %
HEMATOCRIT: 41.1 % (ref 34.0–46.6)
Hemoglobin: 13.5 g/dL (ref 11.1–15.9)
IMMATURE GRANS (ABS): 0 10*3/uL (ref 0.0–0.1)
IMMATURE GRANULOCYTES: 0 %
LYMPHS: 35 %
Lymphocytes Absolute: 2.2 10*3/uL (ref 0.7–3.1)
MCH: 31.3 pg (ref 26.6–33.0)
MCHC: 32.8 g/dL (ref 31.5–35.7)
MCV: 95 fL (ref 79–97)
MONOCYTES: 6 %
Monocytes Absolute: 0.4 10*3/uL (ref 0.1–0.9)
NEUTROS PCT: 55 %
Neutrophils Absolute: 3.5 10*3/uL (ref 1.4–7.0)
Platelets: 303 10*3/uL (ref 150–379)
RBC: 4.31 x10E6/uL (ref 3.77–5.28)
RDW: 12.2 % — ABNORMAL LOW (ref 12.3–15.4)
WBC: 6.3 10*3/uL (ref 3.4–10.8)

## 2017-09-20 LAB — TSH: TSH: 2.39 u[IU]/mL (ref 0.450–4.500)

## 2017-09-20 LAB — HIV ANTIBODY (ROUTINE TESTING W REFLEX): HIV Screen 4th Generation wRfx: NONREACTIVE

## 2017-09-20 LAB — RPR: RPR: NONREACTIVE

## 2017-09-20 LAB — HSV(HERPES SIMPLEX VRS) I + II AB-IGG: HSV 2 IgG, Type Spec: <0.91 index (ref 0.00–0.90)

## 2017-09-21 LAB — GC/CHLAMYDIA PROBE AMP
CHLAMYDIA, DNA PROBE: NEGATIVE
Neisseria gonorrhoeae by PCR: NEGATIVE

## 2017-09-24 ENCOUNTER — Encounter: Payer: Self-pay | Admitting: Obstetrics & Gynecology

## 2017-09-30 ENCOUNTER — Encounter: Payer: Self-pay | Admitting: Family Medicine

## 2017-10-01 ENCOUNTER — Other Ambulatory Visit: Payer: Self-pay | Admitting: Family Medicine

## 2017-10-01 MED ORDER — DULOXETINE HCL 30 MG PO CPEP: 30.0000 mg | ORAL_CAPSULE | Freq: Every day | ORAL | 1 refills | Status: DC

## 2017-10-29 ENCOUNTER — Encounter: Payer: Self-pay | Admitting: Family Medicine

## 2017-10-30 ENCOUNTER — Other Ambulatory Visit: Payer: Self-pay | Admitting: Family Medicine

## 2017-10-30 MED ORDER — DULOXETINE HCL 30 MG PO CPEP: 30.0000 mg | ORAL_CAPSULE | Freq: Every day | ORAL | 1 refills | Status: DC

## 2017-11-16 ENCOUNTER — Ambulatory Visit: Payer: Managed Care, Other (non HMO) | Admitting: Family Medicine

## 2017-12-04 ENCOUNTER — Encounter: Payer: Self-pay | Admitting: Obstetrics & Gynecology

## 2017-12-05 NOTE — Telephone Encounter (Signed)
Please advise 

## 2017-12-06 ENCOUNTER — Other Ambulatory Visit: Payer: Self-pay | Admitting: Obstetrics & Gynecology

## 2017-12-06 DIAGNOSIS — R102 Pelvic and perineal pain: Secondary | ICD-10-CM

## 2017-12-06 DIAGNOSIS — E282 Polycystic ovarian syndrome: Secondary | ICD-10-CM

## 2017-12-06 NOTE — Telephone Encounter (Signed)
Called and left voice mail for patient to call back to be schedule °

## 2017-12-06 NOTE — Telephone Encounter (Signed)
Please schedule GYB [place] and F/u Appt PH as available (also to have PAP).

## 2017-12-07 NOTE — Telephone Encounter (Signed)
Called and left voice mail for patient to call back to be schedule °

## 2017-12-25 ENCOUNTER — Ambulatory Visit: Payer: Managed Care, Other (non HMO) | Admitting: Family Medicine

## 2017-12-25 ENCOUNTER — Encounter: Payer: Self-pay | Admitting: Family Medicine

## 2017-12-25 VITALS — BP 162/101 | HR 76 | Temp 98.0°F | Wt 217.1 lb

## 2017-12-25 DIAGNOSIS — H539 Unspecified visual disturbance: Secondary | ICD-10-CM

## 2017-12-25 DIAGNOSIS — R29818 Other symptoms and signs involving the nervous system: Secondary | ICD-10-CM | POA: Diagnosis not present

## 2017-12-25 DIAGNOSIS — F339 Major depressive disorder, recurrent, unspecified: Secondary | ICD-10-CM | POA: Diagnosis not present

## 2017-12-25 DIAGNOSIS — R253 Fasciculation: Secondary | ICD-10-CM | POA: Diagnosis not present

## 2017-12-25 DIAGNOSIS — R410 Disorientation, unspecified: Secondary | ICD-10-CM

## 2017-12-25 MED ORDER — NEEDLES & SYRINGES MISC: 1.0000 | 1 refills | Status: DC

## 2017-12-25 NOTE — Assessment & Plan Note (Signed)
Not under good control, but having some strange symptoms at this time, which it is unclear if these are due to cymbalta or not. Will leave her on current regimen and recheck following MRI.

## 2017-12-25 NOTE — Assessment & Plan Note (Signed)
Of unclear etiology at this time. Unclear if this is due to her cymbalta or another organic cause. Labs were normal at last visit a month ago. Will check ANA. Concern for MS vs TIA vs seizure. Will get her an MRI and will also get her into see neurology. Will follow up following MRI. Continue to monitor closely. Call with any concerns.

## 2017-12-25 NOTE — Progress Notes (Signed)
BP (!) 162/101 (BP Location: Right Arm, Patient Position: Sitting, Cuff Size: Large)   Pulse 76   Temp 98 F (36.7 C)   Wt 217 lb 1 oz (98.5 kg)   SpO2 99%   BMI 35.57 kg/m    Subjective:    Patient ID: Ruth Bass, female    DOB: 1986-07-09, 32 y.o.   MRN: [id number]  HPI: [name] is a 32 y.o. female  Chief Complaint  Patient presents with  . Depression   Has been having issues where she sees something, her tongue goes numb, she gets very sensitive to light. She does not get a headache with this. She notes that this seems to get worse and more common since she started on the cymbalta. Happening 3-4x a week. Gets very confused with it. Has trouble talking. + dizziness. Lasts a couple of hours. Stays confused afterwards for up to several hours. She states that this is almost like a migraine, but she has never had any headaches. She notes that it seems to be getting worse over the past couple of months. It may be getting worse with the addition of the cymbalta. It also seems to get worse in time. She notes that she has had what she describes as nerve pain in her hands and arms that seems to go along with rashes. She has never had any imaging done on her head.   DEPRESSION Mood status: uncontrolled Satisfied with current treatment?: no Symptom severity: moderate  Duration of current treatment : chronic Side effects: unknown Medication compliance: excellent compliance Psychotherapy/counseling: no  Previous psychiatric medications: cymbalta Depressed mood: yes Anxious mood: yes Anhedonia: no Significant weight loss or gain: no Insomnia: no  Fatigue: yes Feelings of worthlessness or guilt: no Impaired concentration/indecisiveness: no Suicidal ideations: no Hopelessness: no Crying spells: no Depression screen [facility] 2/9 12/25/2017 09/19/2017  Decreased Interest 2 2  Down, Depressed, Hopeless 1 1  PHQ - 2 Score 3 3  Altered sleeping 2 3  Tired, decreased energy 2 3    Change in appetite 1 1  Feeling bad or failure about yourself  1 0  Trouble concentrating 3 1  Moving slowly or fidgety/restless 1 1  Suicidal thoughts 1 0  PHQ-9 Score 14 12  Difficult doing work/chores Somewhat difficult -    Relevant past medical, surgical, family and social history reviewed and updated as indicated. Interim medical history since our last visit reviewed. Allergies and medications reviewed and updated.  Review of Systems  Constitutional: Negative.   HENT: Negative.   Eyes: Negative.   Respiratory: Negative.   Cardiovascular: Negative.   Neurological: Positive for dizziness, tremors and numbness. Negative for seizures, syncope, facial asymmetry, speech difficulty, weakness, light-headedness and headaches.  Hematological: Negative.   Psychiatric/Behavioral: Negative.     Per HPI unless specifically indicated above     Objective:    BP (!) 162/101 (BP Location: Right Arm, Patient Position: Sitting, Cuff Size: Large)   Pulse 76   Temp 98 F (36.7 C)   Wt 217 lb 1 oz (98.5 kg)   SpO2 99%   BMI 35.57 kg/m   Wt Readings from Last 3 Encounters:  12/25/17 217 lb 1 oz (98.5 kg)  09/19/17 222 lb 9.6 oz (101 kg)  08/24/17 227 lb 9.6 oz (103.2 kg)    Physical Exam  Constitutional: She is oriented to person, place, and time. She appears well-developed and well-nourished. No distress.  HENT:  Head: Normocephalic and atraumatic.  Right Ear: Hearing  normal.  Left Ear: Hearing normal.  Nose: Nose normal.  Eyes: Conjunctivae and lids are normal. Right eye exhibits no discharge. Left eye exhibits no discharge. No scleral icterus.  Cardiovascular: Normal rate, regular rhythm, normal heart sounds and intact distal pulses. Exam reveals no gallop and no friction rub.  No murmur heard. Pulmonary/Chest: Effort normal and breath sounds normal. No stridor. No respiratory distress. She has no wheezes. She has no rales. She exhibits no tenderness.  Musculoskeletal: Normal  range of motion.  Neurological: She is alert and oriented to person, place, and time. She displays normal reflexes. No cranial nerve deficit or sensory deficit. She exhibits normal muscle tone. Coordination normal.  Skin: Skin is warm, dry and intact. Capillary refill takes less than 2 seconds. No rash noted. She is not diaphoretic. No erythema. No pallor.  Psychiatric: She has a normal mood and affect. Her speech is normal and behavior is normal. Judgment and thought content normal. Cognition and memory are normal.  Nursing note and vitals reviewed.   Results for orders placed or performed in visit on 09/19/17  GC/Chlamydia Probe Amp  Result Value Ref Range   Chlamydia trachomatis, NAA Negative Negative   Neisseria gonorrhoeae by PCR Negative Negative  Microscopic Examination  Result Value Ref Range   WBC, UA 0-5 0 - 5 /hpf   RBC, UA 0-2 0 - 2 /hpf   Epithelial Cells (non renal) 0-10 0 - 10 /hpf   Casts Present None seen /lpf   Cast Type Hyaline casts N/A   Bacteria, UA None seen None seen/Few  CBC with Differential/Platelet  Result Value Ref Range   WBC 6.3 3.4 - 10.8 x10E3/uL   RBC 4.31 3.77 - 5.28 x10E6/uL   Hemoglobin 13.5 11.1 - 15.9 g/dL   Hematocrit 41.1 34.0 - 46.6 %   MCV 95 79 - 97 fL   MCH 31.3 26.6 - 33.0 pg   MCHC 32.8 31.5 - 35.7 g/dL   RDW 12.2 (L) 12.3 - 15.4 %   Platelets 303 150 - 379 x10E3/uL   Neutrophils 55 Not Estab. %   Lymphs 35 Not Estab. %   Monocytes 6 Not Estab. %   Eos 3 Not Estab. %   Basos 1 Not Estab. %   Neutrophils Absolute 3.5 1.4 - 7.0 x10E3/uL   Lymphocytes Absolute 2.2 0.7 - 3.1 x10E3/uL   Monocytes Absolute 0.4 0.1 - 0.9 x10E3/uL   EOS (ABSOLUTE) 0.2 0.0 - 0.4 x10E3/uL   Basophils Absolute 0.1 0.0 - 0.2 x10E3/uL   Immature Granulocytes 0 Not Estab. %   Immature Grans (Abs) 0.0 0.0 - 0.1 x10E3/uL  Comprehensive metabolic panel  Result Value Ref Range   Glucose 82 65 - 99 mg/dL   BUN 9 6 - 20 mg/dL   Creatinine, Ser 0.68 0.57 - 1.00  mg/dL   GFR calc non Af Amer 117 >59 mL/min/1.73   GFR calc Af Amer 135 >59 mL/min/1.73   BUN/Creatinine Ratio 13 9 - 23   Sodium 140 134 - 144 mmol/L   Potassium 4.0 3.5 - 5.2 mmol/L   Chloride 103 96 - 106 mmol/L   CO2 21 20 - 29 mmol/L   Calcium 9.6 8.7 - 10.2 mg/dL   Total Protein 7.1 6.0 - 8.5 g/dL   Albumin 4.7 3.5 - 5.5 g/dL   Globulin, Total 2.4 1.5 - 4.5 g/dL   Albumin/Globulin Ratio 2.0 1.2 - 2.2   Bilirubin Total 0.6 0.0 - 1.2 mg/dL   Alkaline Phosphatase 49  39 - 117 IU/L   AST 41 (H) 0 - 40 IU/L   ALT 61 (H) 0 - 32 IU/L  Lipid Panel w/o Chol/HDL Ratio  Result Value Ref Range   Cholesterol, Total 149 100 - 199 mg/dL   Triglycerides 115 0 - 149 mg/dL   HDL 41 >39 mg/dL   VLDL Cholesterol Cal 23 5 - 40 mg/dL   LDL Calculated 85 0 - 99 mg/dL  TSH  Result Value Ref Range   TSH 2.390 0.450 - 4.500 uIU/mL  UA/M w/rflx Culture, Routine  Result Value Ref Range   Specific Gravity, UA >1.030 (H) 1.005 - 1.030   pH, UA 6.0 5.0 - 7.5   Color, UA Orange Yellow   Appearance Ur Hazy (A) Clear   Leukocytes, UA Negative Negative   Protein, UA 2+ (A) Negative/Trace   Glucose, UA Trace (A) Negative   Ketones, UA 2+ (A) Negative   RBC, UA Negative Negative   Bilirubin, UA Negative Negative   Urobilinogen, Ur 1.0 0.2 - 1.0 mg/dL   Nitrite, UA Negative Negative   Microscopic Examination See below:   HIV antibody  Result Value Ref Range   HIV Screen 4th Generation wRfx Non Reactive Non Reactive  RPR  Result Value Ref Range   RPR Ser Ql Non Reactive Non Reactive  HSV(herpes simplex vrs) 1+2 ab-IgG  Result Value Ref Range   HSV 1 Glycoprotein G Ab, IgG <0.91 0.00 - 0.90 index   HSV 2 IgG, Type Spec <0.91 0.00 - 0.90 index      Assessment & Plan:   Problem List Items Addressed This Visit      Other   Depression, recurrent (HCC)    Not under good control, but having some strange symptoms at this time, which it is unclear if these are due to cymbalta or not. Will leave her  on current regimen and recheck following MRI.       Other symptoms and signs involving the nervous system - Primary    Of unclear etiology at this time. Unclear if this is due to her cymbalta or another organic cause. Labs were normal at last visit a month ago. Will check ANA. Concern for MS vs TIA vs seizure. Will get her an MRI and will also get her into see neurology. Will follow up following MRI. Continue to monitor closely. Call with any concerns.       Relevant Orders   MR Brain W Wo Contrast    Other Visit Diagnoses    Confusion       Relevant Orders   MR Brain W Wo Contrast   Ambulatory referral to Neurology   Antinuclear Antib (ANA)   Fasciculations       Relevant Orders   MR Brain W Wo Contrast   Ambulatory referral to Neurology   Antinuclear Antib (ANA)   Visual changes       Relevant Orders   MR Brain W Wo Contrast   Ambulatory referral to Neurology   Antinuclear Antib (ANA)       Follow up plan: Return After MRI.

## 2017-12-26 LAB — ANA: Anti Nuclear Antibody(ANA): NEGATIVE

## 2018-01-05 ENCOUNTER — Ambulatory Visit
Admission: RE | Admit: 2018-01-05 | Discharge: 2018-01-05 | Disposition: A | Discharge status: Home / Self Care | Payer: Managed Care, Other (non HMO) | Source: Ambulatory Visit | Attending: Family Medicine | Admitting: Family Medicine

## 2018-01-05 DIAGNOSIS — R9082 White matter disease, unspecified: Secondary | ICD-10-CM | POA: Insufficient documentation

## 2018-01-05 DIAGNOSIS — R410 Disorientation, unspecified: Secondary | ICD-10-CM | POA: Diagnosis not present

## 2018-01-05 DIAGNOSIS — R29818 Other symptoms and signs involving the nervous system: Secondary | ICD-10-CM

## 2018-01-05 DIAGNOSIS — R253 Fasciculation: Secondary | ICD-10-CM | POA: Diagnosis present

## 2018-01-05 DIAGNOSIS — H539 Unspecified visual disturbance: Secondary | ICD-10-CM | POA: Diagnosis not present

## 2018-01-05 MED ORDER — GADOBENATE DIMEGLUMINE 529 MG/ML IV SOLN
20.0000 mL | Freq: Once | INTRAVENOUS | Status: AC | PRN
  Administered 2018-01-05: 20 mL via INTRAVENOUS

## 2018-01-09 ENCOUNTER — Ambulatory Visit (INDEPENDENT_AMBULATORY_CARE_PROVIDER_SITE_OTHER): Payer: Managed Care, Other (non HMO) | Admitting: Obstetrics & Gynecology

## 2018-01-09 ENCOUNTER — Ambulatory Visit (INDEPENDENT_AMBULATORY_CARE_PROVIDER_SITE_OTHER): Payer: Managed Care, Other (non HMO)

## 2018-01-09 ENCOUNTER — Other Ambulatory Visit (HOSPITAL_COMMUNITY)
Admission: RE | Admit: 2018-01-09 | Discharge: 2018-01-09 | Disposition: A | Discharge status: Home / Self Care | Payer: Managed Care, Other (non HMO) | Source: Ambulatory Visit | Attending: Obstetrics & Gynecology | Admitting: Obstetrics & Gynecology

## 2018-01-09 ENCOUNTER — Encounter: Payer: Self-pay | Admitting: Obstetrics & Gynecology

## 2018-01-09 VITALS — BP 148/98 | Ht 66.0 in | Wt 214.0 lb

## 2018-01-09 DIAGNOSIS — E282 Polycystic ovarian syndrome: Secondary | ICD-10-CM | POA: Insufficient documentation

## 2018-01-09 DIAGNOSIS — R87615 Unsatisfactory cytologic smear of cervix: Secondary | ICD-10-CM

## 2018-01-09 DIAGNOSIS — R102 Pelvic and perineal pain: Secondary | ICD-10-CM | POA: Diagnosis not present

## 2018-01-09 NOTE — Addendum Note (Signed)
Addended by: Gae Dry on: 01/09/2018 04:09 PM   Modules accepted: Orders

## 2018-01-09 NOTE — Progress Notes (Signed)
  HPI: Amenorrhea Patient presents for evaluation of prior amenorrhea for many years. Recent Provera led to bleeding for 2 mos, then they became reg on OCPs.  Tol well w mild nausea as side effect, resolving now. Ultrasound demonstrates no masses seen, no cysts, no endometrial defects  PMHx: She  has a past medical history of Amenorrhea. Also,  has no past surgical history on file., family history includes Cancer in her maternal grandmother and paternal aunt; Diabetes in her father and paternal uncle; Heart disease in her father and paternal aunt; Hypertension in her mother; Irritable bowel syndrome in her cousin; Mental illness in her sister; Pancreatitis in her father; Stroke in her maternal grandfather and maternal grandmother.,  reports that she has never smoked. She has never used smokeless tobacco. She reports that she does not drink alcohol or use drugs.  She has a current medication list which includes the following prescription(s): cyclobenzaprine, duloxetine, norethindrone-ethinyl estradiol, acetaminophen, famotidine, multivitamin, and needles & syringes. Also, has No Known Allergies.  Review of Systems  All other systems reviewed and are negative.  Objective: BP (!) 148/98   Ht 5\' 6"  (1.676 m)   Wt 214 lb (97.1 kg)   LMP 12/30/2017   BMI 34.54 kg/m  Physical examination Constitutional NAD, Conversant  Skin No rashes, lesions or ulceration. Normal palpated skin turgor. No nodularity.  Lungs: Clear to auscultation.No rales or wheezes. Normal Respiratory effort, no retractions.  Heart: NSR.  No murmurs or rubs appreciated. No periferal edema  Abdomen: Soft.  Non-tender.  No masses.  No HSM. No hernia  Extremities: Moves all appropriately.  Normal ROM for age. No lymphadenopathy.  Neuro: Grossly intact  Psych: Oriented to PPT.  Normal mood. Normal affect.     Pelvic:   Vulva: Normal appearance.  No lesions.  Vagina: No lesions or abnormalities noted.  Support: Normal pelvic  support.  Urethra No masses tenderness or scarring.  Meatus Normal size without lesions or prolapse.  Cervix: Normal appearance.  No lesions.  Anus: Normal exam.  No lesions.  Perineum: Normal exam.  No lesions.        Bimanual   Uterus: Normal size.  Non-tender.  Mobile.  AV.  Adnexae: No masses.  Non-tender to palpation.  Cul-de-sac: Negative for abnormality.   Assessment:  PCOS (polycystic ovarian syndrome)    Cont OCPs.  Pros and cons discussed, alternatives such as Nuvaring discussed as well.    Not planning pregnancy soon  Unsatisfactory cervical Papanicolaou smear    PAP done  Barnett Applebaum, MD, [facility], [facility] 01/09/2018  2:25 PM

## 2018-01-15 LAB — CYTOLOGY - PAP
DIAGNOSIS: NEGATIVE
HPV (WINDOPATH): NOT DETECTED

## 2018-01-21 ENCOUNTER — Encounter: Payer: Self-pay | Admitting: Family Medicine

## 2018-01-30 DIAGNOSIS — R569 Unspecified convulsions: Secondary | ICD-10-CM | POA: Insufficient documentation

## 2018-02-05 DIAGNOSIS — R29898 Other symptoms and signs involving the musculoskeletal system: Secondary | ICD-10-CM | POA: Insufficient documentation

## 2018-02-05 DIAGNOSIS — M79601 Pain in right arm: Secondary | ICD-10-CM | POA: Insufficient documentation

## 2018-03-05 ENCOUNTER — Other Ambulatory Visit: Payer: Self-pay | Admitting: Family Medicine

## 2018-03-21 ENCOUNTER — Ambulatory Visit: Payer: Managed Care, Other (non HMO) | Admitting: Family Medicine

## 2018-03-21 ENCOUNTER — Encounter: Payer: Self-pay | Admitting: Family Medicine

## 2018-03-21 VITALS — BP 132/80 | HR 72 | Temp 98.0°F | Ht 66.0 in | Wt 218.4 lb

## 2018-03-21 DIAGNOSIS — M2550 Pain in unspecified joint: Secondary | ICD-10-CM | POA: Diagnosis not present

## 2018-03-21 DIAGNOSIS — R5383 Other fatigue: Secondary | ICD-10-CM

## 2018-03-21 DIAGNOSIS — F339 Major depressive disorder, recurrent, unspecified: Secondary | ICD-10-CM

## 2018-03-21 DIAGNOSIS — R030 Elevated blood-pressure reading, without diagnosis of hypertension: Secondary | ICD-10-CM | POA: Diagnosis not present

## 2018-03-21 MED ORDER — HYDROCHLOROTHIAZIDE 12.5 MG PO TABS
12.5000 mg | ORAL_TABLET | Freq: Every day | ORAL | 0 refills | Status: DC
Start: 2018-03-21 — End: 2019-01-22

## 2018-03-21 NOTE — Patient Instructions (Signed)
DASH Eating Plan DASH stands for "Dietary Approaches to Stop Hypertension." The DASH eating plan is a healthy eating plan that has been shown to reduce high blood pressure (hypertension). It may also reduce your risk for type 2 diabetes, heart disease, and stroke. The DASH eating plan may also help with weight loss. What are tips for following this plan? General guidelines  Avoid eating more than 2,300 mg (milligrams) of salt (sodium) a day. If you have hypertension, you may need to reduce your sodium intake to 1,500 mg a day.  Limit alcohol intake to no more than 1 drink a day for nonpregnant women and 2 drinks a day for men. One drink equals 12 oz of beer, 5 oz of wine, or 1 oz of hard liquor.  Work with your health care provider to maintain a healthy body weight or to lose weight. Ask what an ideal weight is for you.  Get at least 30 minutes of exercise that causes your heart to beat faster (aerobic exercise) most days of the week. Activities may include walking, swimming, or biking.  Work with your health care provider or diet and nutrition specialist (dietitian) to adjust your eating plan to your individual calorie needs. Reading food labels  Check food labels for the amount of sodium per serving. Choose foods with less than 5 percent of the Daily Value of sodium. Generally, foods with less than 300 mg of sodium per serving fit into this eating plan.  To find whole grains, look for the word "whole" as the first word in the ingredient list. Shopping  Buy products labeled as "low-sodium" or "no salt added."  Buy fresh foods. Avoid canned foods and premade or frozen meals. Cooking  Avoid adding salt when cooking. Use salt-free seasonings or herbs instead of table salt or sea salt. Check with your health care provider or pharmacist before using salt substitutes.  Do not fry foods. Cook foods using healthy methods such as baking, boiling, grilling, and broiling instead.  Cook with  heart-healthy oils, such as olive, canola, soybean, or sunflower oil. Meal planning   Eat a balanced diet that includes: ? 5 or more servings of fruits and vegetables each day. At each meal, try to fill half of your plate with fruits and vegetables. ? Up to 6-8 servings of whole grains each day. ? Less than 6 oz of lean meat, poultry, or fish each day. A 3-oz serving of meat is about the same size as a deck of cards. One egg equals 1 oz. ? 2 servings of low-fat dairy each day. ? A serving of nuts, seeds, or beans 5 times each week. ? Heart-healthy fats. Healthy fats called Omega-3 fatty acids are found in foods such as flaxseeds and coldwater fish, like sardines, salmon, and mackerel.  Limit how much you eat of the following: ? Canned or prepackaged foods. ? Food that is high in trans fat, such as fried foods. ? Food that is high in saturated fat, such as fatty meat. ? Sweets, desserts, sugary drinks, and other foods with added sugar. ? Full-fat dairy products.  Do not salt foods before eating.  Try to eat at least 2 vegetarian meals each week.  Eat more home-cooked food and less restaurant, buffet, and fast food.  When eating at a restaurant, ask that your food be prepared with less salt or no salt, if possible. What foods are recommended? The items listed may not be a complete list. Talk with your dietitian about what   dietary choices are best for you. Grains Whole-grain or whole-wheat bread. Whole-grain or whole-wheat pasta. Brown rice. Oatmeal. Quinoa. Bulgur. Whole-grain and low-sodium cereals. Pita bread. Low-fat, low-sodium crackers. Whole-wheat flour tortillas. Vegetables Fresh or frozen vegetables (raw, steamed, roasted, or grilled). Low-sodium or reduced-sodium tomato and vegetable juice. Low-sodium or reduced-sodium tomato sauce and tomato paste. Low-sodium or reduced-sodium canned vegetables. Fruits All fresh, dried, or frozen fruit. Canned fruit in natural juice (without  added sugar). Meat and other protein foods Skinless chicken or turkey. Ground chicken or turkey. Pork with fat trimmed off. Fish and seafood. Egg whites. Dried beans, peas, or lentils. Unsalted nuts, nut butters, and seeds. Unsalted canned beans. Lean cuts of beef with fat trimmed off. Low-sodium, lean deli meat. Dairy Low-fat (1%) or fat-free (skim) milk. Fat-free, low-fat, or reduced-fat cheeses. Nonfat, low-sodium ricotta or cottage cheese. Low-fat or nonfat yogurt. Low-fat, low-sodium cheese. Fats and oils Soft margarine without trans fats. Vegetable oil. Low-fat, reduced-fat, or light mayonnaise and salad dressings (reduced-sodium). Canola, safflower, olive, soybean, and sunflower oils. Avocado. Seasoning and other foods Herbs. Spices. Seasoning mixes without salt. Unsalted popcorn and pretzels. Fat-free sweets. What foods are not recommended? The items listed may not be a complete list. Talk with your dietitian about what dietary choices are best for you. Grains Baked goods made with fat, such as croissants, muffins, or some breads. Dry pasta or rice meal packs. Vegetables Creamed or fried vegetables. Vegetables in a cheese sauce. Regular canned vegetables (not low-sodium or reduced-sodium). Regular canned tomato sauce and paste (not low-sodium or reduced-sodium). Regular tomato and vegetable juice (not low-sodium or reduced-sodium). Pickles. Olives. Fruits Canned fruit in a light or heavy syrup. Fried fruit. Fruit in cream or butter sauce. Meat and other protein foods Fatty cuts of meat. Ribs. Fried meat. Bacon. Sausage. Bologna and other processed lunch meats. Salami. Fatback. Hotdogs. Bratwurst. Salted nuts and seeds. Canned beans with added salt. Canned or smoked fish. Whole eggs or egg yolks. Chicken or turkey with skin. Dairy Whole or 2% milk, cream, and half-and-half. Whole or full-fat cream cheese. Whole-fat or sweetened yogurt. Full-fat cheese. Nondairy creamers. Whipped toppings.  Processed cheese and cheese spreads. Fats and oils Butter. Stick margarine. Lard. Shortening. Ghee. Bacon fat. Tropical oils, such as coconut, palm kernel, or palm oil. Seasoning and other foods Salted popcorn and pretzels. Onion salt, garlic salt, seasoned salt, table salt, and sea salt. Worcestershire sauce. Tartar sauce. Barbecue sauce. Teriyaki sauce. Soy sauce, including reduced-sodium. Steak sauce. Canned and packaged gravies. Fish sauce. Oyster sauce. Cocktail sauce. Horseradish that you find on the shelf. Ketchup. Mustard. Meat flavorings and tenderizers. Bouillon cubes. Hot sauce and Tabasco sauce. Premade or packaged marinades. Premade or packaged taco seasonings. Relishes. Regular salad dressings. Where to find more information:  National Heart, Lung, and Blood Institute: www.nhlbi.nih.gov  American Heart Association: www.heart.org Summary  The DASH eating plan is a healthy eating plan that has been shown to reduce high blood pressure (hypertension). It may also reduce your risk for type 2 diabetes, heart disease, and stroke.  With the DASH eating plan, you should limit salt (sodium) intake to 2,300 mg a day. If you have hypertension, you may need to reduce your sodium intake to 1,500 mg a day.  When on the DASH eating plan, aim to eat more fresh fruits and vegetables, whole grains, lean proteins, low-fat dairy, and heart-healthy fats.  Work with your health care provider or diet and nutrition specialist (dietitian) to adjust your eating plan to your individual   calorie needs. This information is not intended to replace advice given to you by your health care provider. Make sure you discuss any questions you have with your health care provider. Document Released: 06/15/2011 Document Revised: 06/19/2016 Document Reviewed: 06/19/2016 [employer] Interactive Patient Education  2018 [employer]  

## 2018-03-21 NOTE — Progress Notes (Signed)
BP 132/80 (BP Location: Right Arm, Patient Position: Sitting, Cuff Size: Normal)   Pulse 72   Temp 98 F (36.7 C) (Oral)   Ht 5\' 6"  (1.676 m)   Wt 218 lb 6.4 oz (99.1 kg)   SpO2 98%   BMI 35.25 kg/m    Subjective:    Patient ID: Ruth Bass, female    DOB: July 09, 1986, 32 y.o.   MRN: [id number]  HPI: Ruth Bass is a 32 y.o. female  Chief Complaint  Patient presents with  . Hypertension    Patient states her blood pressure has been high at her last doctor appts and at home.  . Labs Only    Patient's husband tested positive for Lyme Disease and she would like to be tested too.   Here today for BP concerns. Getting elevated readings pretty consistently anymore. 148/108 at specialist, 150/110 at home. Denies CP, SOB, HAs, dizziness, syncope. Has never been diagnosed with htn in the past or treated with medications for BPs. No diet changes, new stressors but has started oral birth control in the past 6 months.   Would also like to be tested for Lyme disease and RMSF as her partner recently was diagnosed with Lyme disease through Neurology and she has had some tick bites in the past 6 months. No acute new sxs but has been dealing with chronic fatigue and arthralgias/myalgias for some time now. Denies fevers, rash, headaches.   Stopped her cymbalta, felt it was helping her depression some but no longer helping her vague aches and pains thought to possibly be from fibromyalgia. Does not want to take it anymore.   Past Medical History:  Diagnosis Date  . Amenorrhea    Since child, periods severely irregular. Over time periods stopped.   Social History   Socioeconomic History  . Marital status: Married    Spouse name: Not on file  . Number of children: Not on file  . Years of education: Not on file  . Highest education level: Not on file  Occupational History  . Not on file  Social Needs  . Financial resource strain: Not on file  . Food insecurity:    Worry: Not on  file    Inability: Not on file  . Transportation needs:    Medical: Not on file    Non-medical: Not on file  Tobacco Use  . Smoking status: Never Smoker  . Smokeless tobacco: Never Used  Substance and Sexual Activity  . Alcohol use: No  . Drug use: No  . Sexual activity: Yes    Birth control/protection: None, Pill  Lifestyle  . Physical activity:    Days per week: Not on file    Minutes per session: Not on file  . Stress: Not on file  Relationships  . Social connections:    Talks on phone: Not on file    Gets together: Not on file    Attends religious service: Not on file    Active member of club or organization: Not on file    Attends meetings of clubs or organizations: Not on file    Relationship status: Not on file  . Intimate partner violence:    Fear of current or ex partner: Not on file    Emotionally abused: Not on file    Physically abused: Not on file    Forced sexual activity: Not on file  Other Topics Concern  . Not on file  Social History Narrative  . Not on file  Relevant past medical, surgical, family and social history reviewed and updated as indicated. Interim medical history since our last visit reviewed. Allergies and medications reviewed and updated.  Review of Systems  Per HPI unless specifically indicated above     Objective:    BP 132/80 (BP Location: Right Arm, Patient Position: Sitting, Cuff Size: Normal)   Pulse 72   Temp 98 F (36.7 C) (Oral)   Ht 5\' 6"  (1.676 m)   Wt 218 lb 6.4 oz (99.1 kg)   SpO2 98%   BMI 35.25 kg/m   Wt Readings from Last 3 Encounters:  03/21/18 218 lb 6.4 oz (99.1 kg)  01/09/18 214 lb (97.1 kg)  12/25/17 217 lb 1 oz (98.5 kg)    Physical Exam  Constitutional: She is oriented to person, place, and time. She appears well-developed and well-nourished. No distress.  HENT:  Head: Atraumatic.  Eyes: Conjunctivae and EOM are normal.  Neck: Normal range of motion. Neck supple.  Cardiovascular: Normal rate and  regular rhythm.  Pulmonary/Chest: Effort normal and breath sounds normal.  Musculoskeletal: Normal range of motion.  Neurological: She is alert and oriented to person, place, and time.  Skin: Skin is warm and dry.  Psychiatric: She has a normal mood and affect. Her behavior is normal.  Nursing note and vitals reviewed.   Results for orders placed or performed in visit on 03/21/18  Rocky mtn spotted fvr abs pnl(IgG+IgM)  Result Value Ref Range   RMSF IgG Negative Negative   RMSF IgM 0.48 0.00 - 0.89 index  Lyme Ab/Western Blot Reflex  Result Value Ref Range   Lyme IgG/IgM Ab <0.91 0.00 - 0.90 ISR   LYME DISEASE AB, QUANT, IGM <0.80 0.00 - 0.79 index      Assessment & Plan:   Problem List Items Addressed This Visit      Other   Depression, recurrent ([place])    Noted some benefit on cymbalta, but does not want to remain on treatment at this time. Continue to monitor      Elevated blood pressure reading - Primary    Discussed possibly from her contraceptives, pt prefers not to come off of them. Trial HCTZ, DASH diet, increased exercise. monitor readings at home and call with persistent abnormals       Other Visit Diagnoses    Arthralgia, unspecified joint       Will test for tick illness as requested and tx as needed.   Relevant Orders   Rocky mtn spotted fvr abs pnl(IgG+IgM) (Completed)   Lyme Ab/Western Blot Reflex (Completed)   Fatigue, unspecified type       Relevant Orders   Rocky mtn spotted fvr abs pnl(IgG+IgM) (Completed)   Lyme Ab/Western Blot Reflex (Completed)       Follow up plan: Return in about 4 weeks (around 04/18/2018) for BP.

## 2018-03-22 ENCOUNTER — Ambulatory Visit: Payer: Managed Care, Other (non HMO) | Admitting: Family Medicine

## 2018-03-23 LAB — ROCKY MTN SPOTTED FVR ABS PNL(IGG+IGM)
RMSF IgG: NEGATIVE
RMSF IgM: 0.48 index (ref 0.00–0.89)

## 2018-03-23 LAB — LYME AB/WESTERN BLOT REFLEX: Lyme IgG/IgM Ab: <0.91 {ISR} (ref 0.00–0.90)

## 2018-03-27 DIAGNOSIS — R030 Elevated blood-pressure reading, without diagnosis of hypertension: Secondary | ICD-10-CM | POA: Insufficient documentation

## 2018-03-27 NOTE — Assessment & Plan Note (Signed)
Noted some benefit on cymbalta, but does not want to remain on treatment at this time. Continue to monitor

## 2018-03-27 NOTE — Assessment & Plan Note (Signed)
Discussed possibly from her contraceptives, pt prefers not to come off of them. Trial HCTZ, DASH diet, increased exercise. monitor readings at home and call with persistent abnormals

## 2018-04-22 ENCOUNTER — Encounter: Payer: Self-pay | Admitting: Obstetrics & Gynecology

## 2018-04-25 ENCOUNTER — Other Ambulatory Visit: Payer: Self-pay | Admitting: Obstetrics & Gynecology

## 2018-04-25 MED ORDER — DROSPIRENONE-ETHINYL ESTRADIOL 3-0.02 MG PO TABS: 1.0000 | ORAL_TABLET | Freq: Every day | ORAL | 11 refills | Status: DC

## 2018-04-26 ENCOUNTER — Encounter: Payer: Self-pay | Admitting: Family Medicine

## 2018-05-29 DIAGNOSIS — M2550 Pain in unspecified joint: Secondary | ICD-10-CM | POA: Insufficient documentation

## 2018-05-29 DIAGNOSIS — M436 Torticollis: Secondary | ICD-10-CM | POA: Insufficient documentation

## 2018-06-26 ENCOUNTER — Other Ambulatory Visit: Payer: Self-pay | Admitting: Internal Medicine

## 2018-06-26 DIAGNOSIS — M5412 Radiculopathy, cervical region: Secondary | ICD-10-CM

## 2018-07-04 ENCOUNTER — Other Ambulatory Visit (HOSPITAL_COMMUNITY): Payer: Self-pay | Admitting: Internal Medicine

## 2018-07-04 DIAGNOSIS — M5412 Radiculopathy, cervical region: Secondary | ICD-10-CM

## 2018-07-05 ENCOUNTER — Ambulatory Visit: Payer: Managed Care, Other (non HMO)

## 2018-07-06 ENCOUNTER — Ambulatory Visit (HOSPITAL_COMMUNITY)
Admission: RE | Admit: 2018-07-06 | Discharge: 2018-07-06 | Disposition: A | Discharge status: Home / Self Care | Payer: Managed Care, Other (non HMO) | Source: Ambulatory Visit | Attending: Internal Medicine | Admitting: Internal Medicine

## 2018-07-06 DIAGNOSIS — M5412 Radiculopathy, cervical region: Secondary | ICD-10-CM | POA: Diagnosis present

## 2018-07-06 DIAGNOSIS — M4722 Other spondylosis with radiculopathy, cervical region: Secondary | ICD-10-CM | POA: Diagnosis not present

## 2018-07-06 DIAGNOSIS — M4802 Spinal stenosis, cervical region: Secondary | ICD-10-CM | POA: Diagnosis not present

## 2018-07-22 ENCOUNTER — Encounter: Payer: Self-pay | Admitting: Obstetrics & Gynecology

## 2018-07-22 ENCOUNTER — Other Ambulatory Visit: Payer: Self-pay | Admitting: Obstetrics & Gynecology

## 2018-07-22 MED ORDER — ETONOGESTREL-ETHINYL ESTRADIOL 0.12-0.015 MG/24HR VA RING: VAGINAL_RING | VAGINAL | 11 refills | Status: DC

## 2018-08-07 DIAGNOSIS — R200 Anesthesia of skin: Secondary | ICD-10-CM | POA: Insufficient documentation

## 2019-01-22 ENCOUNTER — Other Ambulatory Visit: Payer: Self-pay

## 2019-01-22 ENCOUNTER — Encounter: Payer: Self-pay | Admitting: Family Medicine

## 2019-01-22 ENCOUNTER — Ambulatory Visit (INDEPENDENT_AMBULATORY_CARE_PROVIDER_SITE_OTHER): Payer: Managed Care, Other (non HMO) | Admitting: Family Medicine

## 2019-01-22 DIAGNOSIS — R42 Dizziness and giddiness: Secondary | ICD-10-CM

## 2019-01-22 DIAGNOSIS — M47812 Spondylosis without myelopathy or radiculopathy, cervical region: Secondary | ICD-10-CM | POA: Insufficient documentation

## 2019-01-22 MED ORDER — MECLIZINE HCL 25 MG PO TABS: 25.0000 mg | ORAL_TABLET | Freq: Three times a day (TID) | ORAL | 0 refills | Status: DC | PRN

## 2019-01-22 NOTE — Progress Notes (Signed)
There were no vitals taken for this visit.   Subjective:    Patient ID: Ruth Bass, female    DOB: Apr 01, 1986, 33 y.o.   MRN: [id number]  HPI: Ruth Bass is a 33 y.o. female  Chief Complaint  Patient presents with   Results    MRI from December   Dizziness   Arm Pain   Screenings    Please look at GAD 7 and PHQ9 scores     This visit was completed via telephone due to the restrictions of the COVID-19 pandemic. All issues as above were discussed and addressed but no physical exam was performed. If it was felt that the patient should be evaluated in the office, they were directed there. The patient verbally consented to this visit. Patient was unable to complete an audio/visual visit due to Technical difficulties,Lack of internet. Due to the catastrophic nature of the COVID-19 pandemic, this visit was done through audio contact only.  Location of the patient: home  Location of the provider: work  Those involved with this call:   Provider: Merrie Roof, PA-C  CMA: Yvonna Alanis, [place] Desk/Registration: Jill Side   Time spent on call: 25 minutes on the phone discussing health concerns. 15 minutes total spent in review of patient's record and preparation of their chart. I verified patient identity using two factors (patient name and date of birth). Patient consents verbally to being seen via telemedicine visit today.   Notes the past week or so will have bad dizziness and vertigo intermittently. Room spinning at times when she stands up or if laying in bed for a long time. Some associated nausea. Has had vertigo in the past before but not this consistently. Denies syncope, CP, SOB, vomiting. Not trying anything OTC for sxs.   Has been having worsening issues with her neck and pain going down arms (left worse than right). Was previously on cymbalta which seemed to work but has since stopped. Had an MRI done 06/2018 which showed some mild foraminal  stenosis at C3-C5 and cervical spondylosis, was recommended to go see Psychiatry for consultation about epidural injections per Rheumatology and Neurology notes. Has not yet done this.   Relevant past medical, surgical, family and social history reviewed and updated as indicated. Interim medical history since our last visit reviewed. Allergies and medications reviewed and updated.  Review of Systems  Per HPI unless specifically indicated above     Objective:    There were no vitals taken for this visit.  Wt Readings from Last 3 Encounters:  03/21/18 218 lb 6.4 oz (99.1 kg)  01/09/18 214 lb (97.1 kg)  12/25/17 217 lb 1 oz (98.5 kg)    Physical Exam  Unable to perform PE as patient had technical difficulties with video technology for today's visit  Results for orders placed or performed in visit on 03/21/18  Rocky mtn spotted fvr abs pnl(IgG+IgM)  Result Value Ref Range   RMSF IgG Negative Negative   RMSF IgM 0.48 0.00 - 0.89 index  Lyme Ab/Western Blot Reflex  Result Value Ref Range   Lyme IgG/IgM Ab <0.91 0.00 - 0.90 ISR   LYME DISEASE AB, QUANT, IGM <0.80 0.00 - 0.79 index      Assessment & Plan:   Problem List Items Addressed This Visit      Musculoskeletal and Integument   Cervical spondylosis    With chronic neck pain radiating down into arms. Discussed several options, including getting back on cymbalta or trial  of gabapentin or other type of neuropathic medication, PT, or Physiatry as recommended. She will consider and call back. Stretches, heat, massage, conditioning recommended in meantime       Other Visit Diagnoses    Dizziness    -  Primary   Consistent with vertigo, declines further workup at this time. Meclizine prn, epley maneuvers. Call if not improving       Follow up plan: Return for CPE.

## 2019-01-22 NOTE — Assessment & Plan Note (Signed)
With chronic neck pain radiating down into arms. Discussed several options, including getting back on cymbalta or trial of gabapentin or other type of neuropathic medication, PT, or Physiatry as recommended. She will consider and call back. Stretches, heat, massage, conditioning recommended in meantime

## 2019-02-25 ENCOUNTER — Encounter: Payer: Self-pay | Admitting: Family Medicine

## 2019-02-27 ENCOUNTER — Other Ambulatory Visit: Payer: Self-pay | Admitting: Family Medicine

## 2019-02-27 DIAGNOSIS — F339 Major depressive disorder, recurrent, unspecified: Secondary | ICD-10-CM

## 2019-03-18 ENCOUNTER — Other Ambulatory Visit: Payer: Self-pay | Admitting: Family Medicine

## 2019-03-19 NOTE — Telephone Encounter (Signed)
Patient's last visit was 01/22/19.

## 2019-03-20 MED ORDER — MECLIZINE HCL 25 MG PO TABS: 25.0000 mg | ORAL_TABLET | Freq: Three times a day (TID) | ORAL | 0 refills | Status: DC | PRN

## 2019-03-20 MED ORDER — CYCLOBENZAPRINE HCL 10 MG PO TABS: 10.0000 mg | ORAL_TABLET | Freq: Three times a day (TID) | ORAL | 0 refills | Status: DC | PRN

## 2019-03-28 ENCOUNTER — Emergency Department
Admission: EM | Admit: 2019-03-28 | Discharge: 2019-03-28 | Disposition: A | Discharge status: Home / Self Care | Payer: Managed Care, Other (non HMO) | Attending: Emergency Medicine | Admitting: Emergency Medicine

## 2019-03-28 ENCOUNTER — Encounter: Payer: Self-pay | Admitting: Emergency Medicine

## 2019-03-28 ENCOUNTER — Other Ambulatory Visit: Payer: Self-pay

## 2019-03-28 DIAGNOSIS — R200 Anesthesia of skin: Secondary | ICD-10-CM | POA: Diagnosis present

## 2019-03-28 DIAGNOSIS — R202 Paresthesia of skin: Secondary | ICD-10-CM | POA: Insufficient documentation

## 2019-03-28 DIAGNOSIS — Z79899 Other long term (current) drug therapy: Secondary | ICD-10-CM | POA: Insufficient documentation

## 2019-03-28 LAB — URINALYSIS, COMPLETE (UACMP) WITH MICROSCOPIC
Bilirubin Urine: NEGATIVE
Glucose, UA: NEGATIVE mg/dL
Hgb urine dipstick: NEGATIVE
Ketones, ur: NEGATIVE mg/dL
Leukocytes,Ua: NEGATIVE
Nitrite: NEGATIVE
Protein, ur: NEGATIVE mg/dL
Specific Gravity, Urine: 1.01 (ref 1.005–1.030)
Squamous Epithelial / HPF: NONE SEEN (ref 0–5)
pH: 6 (ref 5.0–8.0)

## 2019-03-28 LAB — CBC WITH DIFFERENTIAL/PLATELET
Abs Immature Granulocytes: 0.02 10*3/uL (ref 0.00–0.07)
Basophils Absolute: 0.1 10*3/uL (ref 0.0–0.1)
Basophils Relative: 1 %
Eosinophils Absolute: 0.1 10*3/uL (ref 0.0–0.5)
Eosinophils Relative: 2 %
HCT: 40.9 % (ref 36.0–46.0)
Hemoglobin: 14.3 g/dL (ref 12.0–15.0)
Immature Granulocytes: 0 %
Lymphocytes Relative: 41 %
Lymphs Abs: 2.7 10*3/uL (ref 0.7–4.0)
MCH: 31.2 pg (ref 26.0–34.0)
MCHC: 35 g/dL (ref 30.0–36.0)
MCV: 89.1 fL (ref 80.0–100.0)
Monocytes Absolute: 0.4 10*3/uL (ref 0.1–1.0)
Monocytes Relative: 7 %
Neutro Abs: 3.2 10*3/uL (ref 1.7–7.7)
Neutrophils Relative %: 49 %
Platelets: 273 10*3/uL (ref 150–400)
RBC: 4.59 MIL/uL (ref 3.87–5.11)
RDW: 11.3 % — ABNORMAL LOW (ref 11.5–15.5)
WBC: 6.6 10*3/uL (ref 4.0–10.5)
nRBC: 0 % (ref 0.0–0.2)

## 2019-03-28 LAB — BASIC METABOLIC PANEL
Anion gap: 9 (ref 5–15)
BUN: 14 mg/dL (ref 6–20)
CO2: 25 mmol/L (ref 22–32)
Calcium: 9.4 mg/dL (ref 8.9–10.3)
Chloride: 105 mmol/L (ref 98–111)
Creatinine, Ser: 0.86 mg/dL (ref 0.44–1.00)
GFR calc Af Amer: >60 mL/min (ref >60)
GFR calc non Af Amer: >60 mL/min (ref >60)
Glucose, Bld: 135 mg/dL — ABNORMAL HIGH (ref 70–99)
Potassium: 3.8 mmol/L (ref 3.5–5.1)
Sodium: 139 mmol/L (ref 135–145)

## 2019-03-28 LAB — PREGNANCY, URINE: Preg Test, Ur: NEGATIVE

## 2019-03-28 MED ORDER — LACTATED RINGERS IV BOLUS: 1000.0000 mL | Freq: Once | INTRAVENOUS | Status: DC

## 2019-03-28 MED ORDER — PROCHLORPERAZINE MALEATE 10 MG PO TABS: 10.0000 mg | ORAL_TABLET | Freq: Four times a day (QID) | ORAL | 0 refills | Status: DC | PRN

## 2019-03-28 MED ORDER — DIPHENHYDRAMINE HCL 50 MG/ML IJ SOLN
25.0000 mg | Freq: Once | INTRAMUSCULAR | Status: AC
  Administered 2019-03-28: 25 mg via INTRAVENOUS
  Filled 2019-03-28: qty 1

## 2019-03-28 MED ORDER — PROCHLORPERAZINE EDISYLATE 10 MG/2ML IJ SOLN
10.0000 mg | Freq: Once | INTRAMUSCULAR | Status: AC
  Administered 2019-03-28: 10 mg via INTRAVENOUS
  Filled 2019-03-28: qty 2

## 2019-03-28 NOTE — ED Notes (Signed)
Per MD Isaacs, no further protocols at this time.

## 2019-03-28 NOTE — ED Provider Notes (Signed)
[facility] Emergency Department Provider Note   ____________________________________________   First MD Initiated Contact with Patient 03/28/19 2223     (approximate)  I have reviewed the triage vital signs and the nursing notes.   HISTORY  Chief Complaint Numbness    HPI Ruth Bass is a 33 y.o. female with past medical history of depression and PCOS presents to the ED complaining of numbness.  Patient reports she has intermittently dealt with numbness affecting the left side of her body over the past couple of years.  She has had an episode since around 8 PM this evening with similar numbness affecting the entirety of her left-side.  She denies any associated weakness or vision changes, but husband states she has had some difficulty expressing herself.  She denies any associated headache, but does state that she has a "funny feeling" to the left side of her head, has felt nauseous with the light bothering her.  She additionally will notice a smell of smoke before onset of similar symptoms.  She has had an MRI of her head and neck that were unremarkable in the past.        Past Medical History:  Diagnosis Date  . Amenorrhea    Since child, periods severely irregular. Over time periods stopped.    Patient Active Problem List   Diagnosis Date Noted  . Cervical spondylosis 01/22/2019  . Elevated blood pressure reading 03/27/2018  . PCOS (polycystic ovarian syndrome) 01/09/2018  . Other symptoms and signs involving the nervous system 12/25/2017  . Depression, recurrent ([place]) 08/24/2017    History reviewed. No pertinent surgical history.  Prior to Admission medications   Medication Sig Start Date End Date Taking? Authorizing Provider  cyclobenzaprine (FLEXERIL) 10 MG tablet Take 1 tablet (10 mg total) by mouth 3 (three) times daily as needed for muscle spasms. 03/20/19   Volney American, PA-C  meclizine (ANTIVERT) 25 MG tablet Take 1 tablet  (25 mg total) by mouth 3 (three) times daily as needed for dizziness. 03/20/19   Volney American, PA-C  Multiple Vitamin (MULTIVITAMIN) capsule Take 1 capsule by mouth daily.    [provider]  prochlorperazine (COMPAZINE) 10 MG tablet Take 1 tablet (10 mg total) by mouth every 6 (six) hours as needed for nausea or vomiting. 03/28/19   Blake Divine, MD    Allergies Duloxetine  Family History  Problem Relation Age of Onset  . Hypertension Mother   . Diabetes Father   . Heart disease Father   . Pancreatitis Father   . Mental illness Sister   . Heart disease Paternal Aunt   . Cancer Paternal Aunt        Breast  . Diabetes Paternal Uncle   . Cancer Maternal Grandmother        Brain Tumor and Colon Cancer  . Stroke Maternal Grandmother   . Stroke Maternal Grandfather   . Irritable bowel syndrome Cousin     Social History Social History   Tobacco Use  . Smoking status: Never Smoker  . Smokeless tobacco: Never Used  Substance Use Topics  . Alcohol use: No  . Drug use: No    Review of Systems  Constitutional: No fever/chills Eyes: No visual changes. ENT: No sore throat. Cardiovascular: Denies chest pain. Respiratory: Denies shortness of breath. Gastrointestinal: No abdominal pain.  No nausea, no vomiting.  No diarrhea.  No constipation. Genitourinary: Negative for dysuria. Musculoskeletal: Negative for back pain. Skin: Negative for rash. Neurological: Negative  for headaches, focal weakness.  Positive for numbness.  ____________________________________________   PHYSICAL EXAM:  VITAL SIGNS: ED Triage Vitals  Enc Vitals Group     BP 03/28/19 2059 (!) 160/91     Pulse Rate 03/28/19 2059 90     Resp 03/28/19 2059 18     Temp 03/28/19 2059 98 F (36.7 C)     Temp Source 03/28/19 2059 Oral     SpO2 03/28/19 2059 96 %     Weight 03/28/19 2100 210 lb (95.3 kg)     Height 03/28/19 2100 5\' 6"  (1.676 m)     Head Circumference --      Peak Flow --       Pain Score 03/28/19 2100 0     Pain Loc --      Pain Edu? --      Excl. in [place]? --     Constitutional: Alert and oriented. Eyes: Conjunctivae are normal. Head: Atraumatic. Nose: No congestion/rhinnorhea. Mouth/Throat: Mucous membranes are moist. Neck: Normal ROM Cardiovascular: Normal rate, regular rhythm. Grossly normal heart sounds. Respiratory: Normal respiratory effort.  No retractions. Lungs CTAB. Gastrointestinal: Soft and nontender. No distention. Genitourinary: deferred Musculoskeletal: No lower extremity tenderness nor edema. Neurologic:  Normal speech and language.  Cranial nerves II through XII grossly intact.  Strength 5 out of 5 in bilateral upper and lower extremities, no pronator drift.  Some diminished sensation over left upper and lower extremities, but able to distinguish sharp from dull. Skin:  Skin is warm, dry and intact. No rash noted. Psychiatric: Mood and affect are normal. Speech and behavior are normal.  ____________________________________________   LABS (all labs ordered are listed, but only abnormal results are displayed)  Labs Reviewed  CBC WITH DIFFERENTIAL/PLATELET - Abnormal; Notable for the following components:      Result Value   RDW 11.3 (*)    All other components within normal limits  BASIC METABOLIC PANEL - Abnormal; Notable for the following components:   Glucose, Bld 135 (*)    All other components within normal limits  URINALYSIS, COMPLETE (UACMP) WITH MICROSCOPIC - Abnormal; Notable for the following components:   Color, Urine YELLOW (*)    APPearance CLEAR (*)    Bacteria, UA RARE (*)    All other components within normal limits  PREGNANCY, URINE     PROCEDURES  Procedure(s) performed (including Critical Care):  Procedures   ____________________________________________   INITIAL IMPRESSION / ASSESSMENT AND PLAN / ED COURSE       33 year old female presents to the ED complaining of intermittent left-sided numbness  over the past couple of years, now with more severe episode over the past couple of hours.  She has no focal weakness noted on exam, does appear to have some decreased sensation but can distinguish sharp from dull.  Suspect complex migraine given her described symptoms and this would be much more likely in a young healthy female, especially with her negative MRI in the past.  Will trial migraine cocktail and recheck.  Patient with significant improvement in symptoms following migraine cocktail, no further issues with speech per husband and patient reports numbness is now resolved.  Patient and husband agree that MRI is not needed at this time, they will follow-up with neurology, who has seen the patient in the past.  Counseled to return to the ED for new or worsening symptoms, patient agrees with plan.      ____________________________________________   FINAL CLINICAL IMPRESSION(S) / ED DIAGNOSES  Final diagnoses:  Left sided numbness  Paresthesias     ED Discharge Orders         Ordered    prochlorperazine (COMPAZINE) 10 MG tablet  Every 6 hours PRN     03/28/19 2334           Note:  This document was prepared using Dragon voice recognition software and may include unintentional dictation errors.   Blake Divine, MD 03/29/19 [phone]

## 2019-03-28 NOTE — ED Triage Notes (Signed)
Pt arrives POV to triage with c/o numbness x 2 years but now has numbness in her left leg which he states is new. Pt is able to move all extremities with an NIH 0 by this RN. Pt is in NAD.

## 2019-04-10 ENCOUNTER — Encounter: Payer: Self-pay | Admitting: Family Medicine

## 2019-04-10 ENCOUNTER — Other Ambulatory Visit: Payer: Self-pay

## 2019-04-10 ENCOUNTER — Ambulatory Visit (INDEPENDENT_AMBULATORY_CARE_PROVIDER_SITE_OTHER): Payer: Managed Care, Other (non HMO) | Admitting: Family Medicine

## 2019-04-10 VITALS — BP 133/87 | HR 73 | Temp 98.2°F | Ht 66.0 in | Wt 217.0 lb

## 2019-04-10 DIAGNOSIS — M2530 Other instability, unspecified joint: Secondary | ICD-10-CM

## 2019-04-10 DIAGNOSIS — R531 Weakness: Secondary | ICD-10-CM

## 2019-04-10 DIAGNOSIS — G8929 Other chronic pain: Secondary | ICD-10-CM | POA: Diagnosis not present

## 2019-04-10 DIAGNOSIS — M25572 Pain in left ankle and joints of left foot: Secondary | ICD-10-CM

## 2019-04-10 NOTE — Progress Notes (Signed)
BP 133/87   Pulse 73   Temp 98.2 F (36.8 C) (Oral)   Ht 5\' 6"  (1.676 m)   Wt 217 lb (98.4 kg)   SpO2 99%   BMI 35.02 kg/m    Subjective:    Patient ID: Ruth Bass, female    DOB: [birth year], 33 y.o.   MRN: [id number]  HPI: Ruth Bass is a 33 y.o. female  Chief Complaint  Patient presents with  . Joint Pain    joint weakness, hands and legs. whole body at times. x few years ago   Patient presenting today for worsening joint pain, instability and extremity weakness. Had to do PT and wear joint stabilizers as a teenager due to joint instability, still feels like she's having these issues and they've been worsening the last few years now. Having some dislocations especially with knees and fingers but not nearly as bad as when she was younger. States father and siblings have same issue but no genetic testing for connective tissue disorders or other defects have ever been performed. PT was helpful back during her teenage years.   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 Dull ache at rest, sharp shooting with movement and weight bearing. Tried ice, heat, without relief. OTC pain relievers help temporarily but nothing provides long term relief.   Working with Neurology for her chronic dizziness, weakness, and some possible seizure like activity. Was given gabapentin last week at appt but has not yet started the medication. So far has had normal EMG, EEG, MRI brain and c spine.   Relevant past medical, surgical, family and social history reviewed and updated as indicated. Interim medical history since our last visit reviewed. Allergies and medications reviewed and updated.  Review of Systems  Per HPI unless specifically indicated above     Objective:    BP 133/87   Pulse 73   Temp 98.2 F (36.8 C) (Oral)   Ht 5\' 6"  (1.676 m)   Wt 217 lb (98.4 kg)   SpO2 99%   BMI 35.02 kg/m   Wt Readings from Last 3 Encounters:  04/10/19 217 lb (98.4 kg)   03/28/19 210 lb (95.3 kg)  03/21/18 218 lb 6.4 oz (99.1 kg)    Physical Exam Vitals signs and nursing note reviewed.  Constitutional:      Appearance: Normal appearance. She is not ill-appearing.  HENT:     Head: Atraumatic.  Eyes:     Extraocular Movements: Extraocular movements intact.     Conjunctiva/sclera: Conjunctivae normal.  Neck:     Musculoskeletal: Normal range of motion and neck supple.  Cardiovascular:     Rate and Rhythm: Normal rate and regular rhythm.     Heart sounds: Normal heart sounds.  Pulmonary:     Effort: Pulmonary effort is normal.     Breath sounds: Normal breath sounds.  Musculoskeletal: Normal range of motion.        General: No swelling or deformity.  Skin:    General: Skin is warm and dry.  Neurological:     Mental Status: She is alert and oriented to person, place, and time.     Motor: No weakness (strength equal and approrpiate all 4 extremities today).  Psychiatric:        Mood and Affect: Mood normal.        Thought Content: Thought content normal.        Judgment: Judgment normal.     Results for orders placed or performed  during the hospital encounter of 03/28/19  CBC with Differential  Result Value Ref Range   WBC 6.6 4.0 - 10.5 K/uL   RBC 4.59 3.87 - 5.11 MIL/uL   Hemoglobin 14.3 12.0 - 15.0 g/dL   HCT 40.9 36.0 - 46.0 %   MCV 89.1 80.0 - 100.0 fL   MCH 31.2 26.0 - 34.0 pg   MCHC 35.0 30.0 - 36.0 g/dL   RDW 11.3 (L) 11.5 - 15.5 %   Platelets 273 150 - 400 K/uL   nRBC 0.0 0.0 - 0.2 %   Neutrophils Relative % 49 %   Neutro Abs 3.2 1.7 - 7.7 K/uL   Lymphocytes Relative 41 %   Lymphs Abs 2.7 0.7 - 4.0 K/uL   Monocytes Relative 7 %   Monocytes Absolute 0.4 0.1 - 1.0 K/uL   Eosinophils Relative 2 %   Eosinophils Absolute 0.1 0.0 - 0.5 K/uL   Basophils Relative 1 %   Basophils Absolute 0.1 0.0 - 0.1 K/uL   Immature Granulocytes 0 %   Abs Immature Granulocytes 0.02 0.00 - 0.07 K/uL  Basic metabolic panel  Result Value Ref  Range   Sodium 139 135 - 145 mmol/L   Potassium 3.8 3.5 - 5.1 mmol/L   Chloride 105 98 - 111 mmol/L   CO2 25 22 - 32 mmol/L   Glucose, Bld 135 (H) 70 - 99 mg/dL   BUN 14 6 - 20 mg/dL   Creatinine, Ser 0.86 0.44 - 1.00 mg/dL   Calcium 9.4 8.9 - 10.3 mg/dL   GFR calc non Af Amer >60 >60 mL/min   GFR calc Af Amer >60 >60 mL/min   Anion gap 9 5 - 15  Urinalysis, Complete w Microscopic  Result Value Ref Range   Color, Urine YELLOW (A) YELLOW   APPearance CLEAR (A) CLEAR   Specific Gravity, Urine 1.010 1.005 - 1.030   pH 6.0 5.0 - 8.0   Glucose, UA NEGATIVE NEGATIVE mg/dL   Hgb urine dipstick NEGATIVE NEGATIVE   Bilirubin Urine NEGATIVE NEGATIVE   Ketones, ur NEGATIVE NEGATIVE mg/dL   Protein, ur NEGATIVE NEGATIVE mg/dL   Nitrite NEGATIVE NEGATIVE   Leukocytes,Ua NEGATIVE NEGATIVE   RBC / HPF 0-5 0 - 5 RBC/hpf   WBC, UA 0-5 0 - 5 WBC/hpf   Bacteria, UA RARE (A) NONE SEEN   Squamous Epithelial / LPF NONE SEEN 0 - 5   Mucus PRESENT   Pregnancy, urine  Result Value Ref Range   Preg Test, Ur NEGATIVE NEGATIVE      Assessment & Plan:   Problem List Items Addressed This Visit    None    Visit Diagnoses    Joint instability    -  Primary   Will refer to PT and Genetic Counseling for further management and eval.    Relevant Orders   Ambulatory referral to Genetics   Ambulatory referral to Physical Therapy   Chronic pain of left ankle       Will obtain x-ray and refer to Orthopedics for management if confirmed bony protrusion given worsening pain sxs   Relevant Orders   DG Ankle Complete Left   Weakness       Awaiting further Neurology workup with a second [facility] specialist. Start gabapentin, monitor for benefit   Relevant Orders   Ambulatory referral to Physical Therapy       Follow up plan: Return if symptoms worsen or fail to improve.

## 2019-04-10 NOTE — Patient Instructions (Addendum)
[place]  [address] [place],  [place]  [address] Get Driving Directions Main: [phone]

## 2019-04-14 ENCOUNTER — Ambulatory Visit
Admission: RE | Admit: 2019-04-14 | Discharge: 2019-04-14 | Disposition: A | Discharge status: Home / Self Care | Payer: Managed Care, Other (non HMO) | Source: Ambulatory Visit | Attending: Family Medicine | Admitting: Family Medicine

## 2019-04-14 ENCOUNTER — Encounter: Payer: Self-pay | Admitting: Radiology

## 2019-04-14 DIAGNOSIS — M25572 Pain in left ankle and joints of left foot: Secondary | ICD-10-CM

## 2019-04-14 DIAGNOSIS — G8929 Other chronic pain: Secondary | ICD-10-CM | POA: Diagnosis present

## 2019-04-16 ENCOUNTER — Encounter: Payer: Self-pay | Admitting: Family Medicine

## 2019-04-28 ENCOUNTER — Other Ambulatory Visit: Payer: Self-pay | Admitting: Family Medicine

## 2019-04-28 DIAGNOSIS — G8929 Other chronic pain: Secondary | ICD-10-CM

## 2019-06-17 ENCOUNTER — Encounter: Payer: Self-pay | Admitting: Family Medicine

## 2019-06-30 ENCOUNTER — Encounter: Payer: Self-pay | Admitting: Family Medicine

## 2019-06-30 ENCOUNTER — Ambulatory Visit (INDEPENDENT_AMBULATORY_CARE_PROVIDER_SITE_OTHER): Payer: Managed Care, Other (non HMO) | Admitting: Family Medicine

## 2019-06-30 ENCOUNTER — Other Ambulatory Visit: Payer: Self-pay

## 2019-06-30 VITALS — BP 120/74 | HR 87 | Temp 98.1°F | Ht 65.5 in | Wt 217.0 lb

## 2019-06-30 DIAGNOSIS — Z0000 Encounter for general adult medical examination without abnormal findings: Secondary | ICD-10-CM | POA: Diagnosis not present

## 2019-06-30 NOTE — Progress Notes (Signed)
BP 120/74   Pulse 87   Temp 98.1 F (36.7 C) (Oral)   Ht 5' 5.5" (1.664 m)   Wt 217 lb (98.4 kg)   SpO2 97%   BMI 35.56 kg/m    Subjective:    Patient ID: Ruth Bass, female    DOB: 1986-05-09, 33 y.o.   MRN: [id number]  HPI: Ruth Bass is a 33 y.o. female presenting on 06/30/2019 for comprehensive medical examination. Current medical complaints include:none  Started following with Psychiatry, moods improved on zoloft but still very anxious. Has f/u in a week or so with them. Nortirpyline helping with auras which is thought to be what is causing her paresthesias, following with Neuro for this.   She currently lives with: Menopausal Symptoms: no  Depression Screen done today and results listed below:  Depression screen [facility] 2/9 06/30/2019 01/22/2019 12/25/2017 09/19/2017  Decreased Interest 3 3 2 2   Down, Depressed, Hopeless 2 3 1 1   PHQ - 2 Score 5 6 3 3   Altered sleeping 2 0 2 3  Tired, decreased energy 3 3 2 3   Change in appetite 1 0 1 1  Feeling bad or failure about yourself  1 0 1 0  Trouble concentrating 3 3 3 1   Moving slowly or fidgety/restless 1 3 1 1   Suicidal thoughts 1 0 1 0  PHQ-9 Score 17 15 14 12   Difficult doing work/chores - Very difficult Somewhat difficult -    The patient does not have a history of falls. I did complete a risk assessment for falls. A plan of care for falls was documented.   Past Medical History:  Past Medical History:  Diagnosis Date  . Amenorrhea    Since child, periods severely irregular. Over time periods stopped.    Surgical History:  History reviewed. No pertinent surgical history.  Medications:  Current Outpatient Medications on File Prior to Visit  Medication Sig  . cyclobenzaprine (FLEXERIL) 10 MG tablet Take 1 tablet (10 mg total) by mouth 3 (three) times daily as needed for muscle spasms.  . meclizine (ANTIVERT) 25 MG tablet Take 1 tablet (25 mg total) by mouth 3 (three) times daily as needed for dizziness.    . Multiple Vitamin (MULTIVITAMIN) capsule Take 1 capsule by mouth daily.  . nortriptyline (PAMELOR) 10 MG capsule Take by mouth daily.   . promethazine (PHENERGAN) 12.5 MG tablet Take by mouth as needed.   . sertraline (ZOLOFT) 100 MG tablet Take 100 mg by mouth daily.  . prochlorperazine (COMPAZINE) 10 MG tablet Take 1 tablet (10 mg total) by mouth every 6 (six) hours as needed for nausea or vomiting. (Patient not taking: Reported on 06/30/2019)   No current facility-administered medications on file prior to visit.    Allergies:  Allergies  Allergen Reactions  . Duloxetine Other (See Comments)    Seizure-like activity    Social History:  Social History   Socioeconomic History  . Marital status: Married    Spouse name: Not on file  . Number of children: Not on file  . Years of education: Not on file  . Highest education level: Not on file  Occupational History  . Not on file  Tobacco Use  . Smoking status: Never Smoker  . Smokeless tobacco: Never Used  Substance and Sexual Activity  . Alcohol use: No  . Drug use: No  . Sexual activity: Yes    Birth control/protection: None  Other Topics Concern  . Not on file  Social History  Narrative  . Not on file   Social Determinants of Health   Financial Resource Strain:   . Difficulty of Paying Living Expenses: Not on file  Food Insecurity:   . Worried About Charity fundraiser in the Last Year: Not on file  . Ran Out of Food in the Last Year: Not on file  Transportation Needs:   . Lack of Transportation (Medical): Not on file  . Lack of Transportation (Non-Medical): Not on file  Physical Activity:   . Days of Exercise per Week: Not on file  . Minutes of Exercise per Session: Not on file  Stress:   . Feeling of Stress : Not on file  Social Connections:   . Frequency of Communication with Friends and Family: Not on file  . Frequency of Social Gatherings with Friends and Family: Not on file  . Attends Religious  Services: Not on file  . Active Member of Clubs or Organizations: Not on file  . Attends Archivist Meetings: Not on file  . Marital Status: Not on file  Intimate Partner Violence:   . Fear of Current or Ex-Partner: Not on file  . Emotionally Abused: Not on file  . Physically Abused: Not on file  . Sexually Abused: Not on file   Social History   Tobacco Use  Smoking Status Never Smoker  Smokeless Tobacco Never Used   Social History   Substance and Sexual Activity  Alcohol Use No    Family History:  Family History  Problem Relation Age of Onset  . Hypertension Mother   . Diabetes Father   . Heart disease Father   . Pancreatitis Father   . Mental illness Sister   . Heart disease Paternal Aunt   . Cancer Paternal Aunt        Breast  . Diabetes Paternal Uncle   . Cancer Maternal Grandmother        Brain Tumor and Colon Cancer  . Stroke Maternal Grandmother   . Stroke Maternal Grandfather   . Irritable bowel syndrome Cousin     Past medical history, surgical history, medications, allergies, family history and social history reviewed with patient today and changes made to appropriate areas of the chart.   Review of Systems - General ROS: negative Psychological ROS: negative Ophthalmic ROS: negative ENT ROS: negative Allergy and Immunology ROS: negative Hematological and Lymphatic ROS: negative Endocrine ROS: negative Breast ROS: negative for breast lumps Respiratory ROS: no cough, shortness of breath, or wheezing Cardiovascular ROS: no chest pain or dyspnea on exertion Gastrointestinal ROS: no abdominal pain, change in bowel habits, or black or bloody stools Genito-Urinary ROS: no dysuria, trouble voiding, or hematuria Musculoskeletal ROS: negative Neurological ROS: no TIA or stroke symptoms Dermatological ROS: negative All other ROS negative except what is listed above and in the HPI.      Objective:    BP 120/74   Pulse 87   Temp 98.1 F (36.7  C) (Oral)   Ht 5' 5.5" (1.664 m)   Wt 217 lb (98.4 kg)   SpO2 97%   BMI 35.56 kg/m   Wt Readings from Last 3 Encounters:  06/30/19 217 lb (98.4 kg)  04/10/19 217 lb (98.4 kg)  03/28/19 210 lb (95.3 kg)    Physical Exam Vitals and nursing note reviewed.  Constitutional:      General: She is not in acute distress.    Appearance: She is well-developed.  HENT:     Head: Atraumatic.  Right Ear: External ear normal.     Left Ear: External ear normal.     Nose: Nose normal.     Mouth/Throat:     Pharynx: No oropharyngeal exudate.  Eyes:     General: No scleral icterus.    Conjunctiva/sclera: Conjunctivae normal.     Pupils: Pupils are equal, round, and reactive to light.  Neck:     Thyroid: No thyromegaly.  Cardiovascular:     Rate and Rhythm: Normal rate and regular rhythm.     Heart sounds: Normal heart sounds.  Pulmonary:     Effort: Pulmonary effort is normal. No respiratory distress.     Breath sounds: Normal breath sounds.  Abdominal:     General: Bowel sounds are normal.     Palpations: Abdomen is soft. There is no mass.     Tenderness: There is no abdominal tenderness.  Musculoskeletal:        General: No tenderness. Normal range of motion.     Cervical back: Normal range of motion and neck supple.  Lymphadenopathy:     Cervical: No cervical adenopathy.  Skin:    General: Skin is warm and dry.     Findings: No rash.  Neurological:     Mental Status: She is alert and oriented to person, place, and time.     Cranial Nerves: No cranial nerve deficit.  Psychiatric:        Behavior: Behavior normal.     Results for orders placed or performed in visit on 06/30/19  Microscopic Examination   URINE  Result Value Ref Range   WBC, UA 6-10 (A) 0 - 5 /hpf   RBC None seen 0 - 2 /hpf   Epithelial Cells (non renal) 0-10 0 - 10 /hpf   Mucus, UA Present Not Estab.   Bacteria, UA Few (A) None seen/Few  Urine Culture, Reflex   URINE  Result Value Ref Range   Urine  Culture, Routine WILL FOLLOW   CBC with Differential/Platelet out  Result Value Ref Range   WBC 6.6 3.4 - 10.8 x10E3/uL   RBC 4.64 3.77 - 5.28 x10E6/uL   Hemoglobin 14.4 11.1 - 15.9 g/dL   Hematocrit 42.9 34.0 - 46.6 %   MCV 93 79 - 97 fL   MCH 31.0 26.6 - 33.0 pg   MCHC 33.6 31.5 - 35.7 g/dL   RDW 11.6 (L) 11.7 - 15.4 %   Platelets 283 150 - 450 x10E3/uL   Neutrophils 51 Not Estab. %   Lymphs 39 Not Estab. %   Monocytes 7 Not Estab. %   Eos 2 Not Estab. %   Basos 1 Not Estab. %   Neutrophils Absolute 3.3 1.4 - 7.0 x10E3/uL   Lymphocytes Absolute 2.6 0.7 - 3.1 x10E3/uL   Monocytes Absolute 0.5 0.1 - 0.9 x10E3/uL   EOS (ABSOLUTE) 0.2 0.0 - 0.4 x10E3/uL   Basophils Absolute 0.1 0.0 - 0.2 x10E3/uL   Immature Granulocytes 0 Not Estab. %   Immature Grans (Abs) 0.0 0.0 - 0.1 x10E3/uL  Comprehensive metabolic panel  Result Value Ref Range   Glucose 87 65 - 99 mg/dL   BUN 13 6 - 20 mg/dL   Creatinine, Ser 0.70 0.57 - 1.00 mg/dL   GFR calc non Af Amer 114 >59 mL/min/1.73   GFR calc Af Amer 132 >59 mL/min/1.73   BUN/Creatinine Ratio 19 9 - 23   Sodium 142 134 - 144 mmol/L   Potassium 4.1 3.5 - 5.2 mmol/L   Chloride  104 96 - 106 mmol/L   CO2 23 20 - 29 mmol/L   Calcium 9.3 8.7 - 10.2 mg/dL   Total Protein 6.6 6.0 - 8.5 g/dL   Albumin 4.6 3.8 - 4.8 g/dL   Globulin, Total 2.0 1.5 - 4.5 g/dL   Albumin/Globulin Ratio 2.3 (H) 1.2 - 2.2   Bilirubin Total 0.3 0.0 - 1.2 mg/dL   Alkaline Phosphatase 58 39 - 117 IU/L   AST 15 0 - 40 IU/L   ALT 12 0 - 32 IU/L  Lipid Panel w/o Chol/HDL Ratio out  Result Value Ref Range   Cholesterol, Total 161 100 - 199 mg/dL   Triglycerides 148 0 - 149 mg/dL   HDL 41 >39 mg/dL   VLDL Cholesterol Cal 26 5 - 40 mg/dL   LDL Chol Calc (NIH) 94 0 - 99 mg/dL  UA/M w/rflx Culture, Routine   Specimen: Urine   URINE  Result Value Ref Range   Specific Gravity, UA 1.015 1.005 - 1.030   pH, UA 6.5 5.0 - 7.5   Color, UA Yellow Yellow   Appearance Ur Clear  Clear   Leukocytes,UA 1+ (A) Negative   Protein,UA Negative Negative/Trace   Glucose, UA Negative Negative   Ketones, UA Negative Negative   RBC, UA Negative Negative   Bilirubin, UA Negative Negative   Urobilinogen, Ur 0.2 0.2 - 1.0 mg/dL   Nitrite, UA Negative Negative   Microscopic Examination See below:    Urinalysis Reflex Comment       Assessment & Plan:   Problem List Items Addressed This Visit    None    Visit Diagnoses    Annual physical exam    -  Primary   Relevant Orders   CBC with Differential/Platelet out (Completed)   Comprehensive metabolic panel (Completed)   Lipid Panel w/o Chol/HDL Ratio out (Completed)   UA/M w/rflx Culture, Routine (Completed)       Follow up plan: Return in about 1 year (around 06/29/2020) for CPE.   LABORATORY TESTING:  - Pap smear: up to date  IMMUNIZATIONS:   - Tdap: Tetanus vaccination status reviewed: last tetanus booster within 10 years. - Influenza: Refused  PATIENT COUNSELING:   Advised to take 1 mg of folate supplement per day if capable of pregnancy.   Sexuality: Discussed sexually transmitted diseases, partner selection, use of condoms, avoidance of unintended pregnancy  and contraceptive alternatives.   Advised to avoid cigarette smoking.  I discussed with the patient that most people either abstain from alcohol or drink within safe limits (<=14/week and <=4 drinks/occasion for males, <=7/weeks and <= 3 drinks/occasion for females) and that the risk for alcohol disorders and other health effects rises proportionally with the number of drinks per week and how often a drinker exceeds daily limits.  Discussed cessation/primary prevention of drug use and availability of treatment for abuse.   Diet: Encouraged to adjust caloric intake to maintain  or achieve ideal body weight, to reduce intake of dietary saturated fat and total fat, to limit sodium intake by avoiding high sodium foods and not adding table salt, and to  maintain adequate dietary potassium and calcium preferably from fresh fruits, vegetables, and low-fat dairy products.    stressed the importance of regular exercise  Injury prevention: Discussed safety belts, safety helmets, smoke detector, smoking near bedding or upholstery.   Dental health: Discussed importance of regular tooth brushing, flossing, and dental visits.    NEXT PREVENTATIVE PHYSICAL DUE IN 1 YEAR. Return in  about 1 year (around 06/29/2020) for CPE.

## 2019-07-01 DIAGNOSIS — G43109 Migraine with aura, not intractable, without status migrainosus: Secondary | ICD-10-CM | POA: Insufficient documentation

## 2019-07-01 LAB — COMPREHENSIVE METABOLIC PANEL
ALT: 12 IU/L (ref 0–32)
AST: 15 IU/L (ref 0–40)
Albumin/Globulin Ratio: 2.3 — ABNORMAL HIGH (ref 1.2–2.2)
Albumin: 4.6 g/dL (ref 3.8–4.8)
Alkaline Phosphatase: 58 IU/L (ref 39–117)
BUN/Creatinine Ratio: 19 (ref 9–23)
BUN: 13 mg/dL (ref 6–20)
Bilirubin Total: 0.3 mg/dL (ref 0.0–1.2)
CO2: 23 mmol/L (ref 20–29)
Calcium: 9.3 mg/dL (ref 8.7–10.2)
Chloride: 104 mmol/L (ref 96–106)
Creatinine, Ser: 0.7 mg/dL (ref 0.57–1.00)
GFR calc Af Amer: 132 mL/min/{1.73_m2} (ref >59)
GFR calc non Af Amer: 114 mL/min/{1.73_m2} (ref >59)
Globulin, Total: 2 g/dL (ref 1.5–4.5)
Glucose: 87 mg/dL (ref 65–99)
Potassium: 4.1 mmol/L (ref 3.5–5.2)
Sodium: 142 mmol/L (ref 134–144)
Total Protein: 6.6 g/dL (ref 6.0–8.5)

## 2019-07-01 LAB — CBC WITH DIFFERENTIAL/PLATELET
Basophils Absolute: 0.1 10*3/uL (ref 0.0–0.2)
Basos: 1 %
EOS (ABSOLUTE): 0.2 10*3/uL (ref 0.0–0.4)
Eos: 2 %
Hematocrit: 42.9 % (ref 34.0–46.6)
Hemoglobin: 14.4 g/dL (ref 11.1–15.9)
Immature Grans (Abs): 0 10*3/uL (ref 0.0–0.1)
Immature Granulocytes: 0 %
Lymphocytes Absolute: 2.6 10*3/uL (ref 0.7–3.1)
Lymphs: 39 %
MCH: 31 pg (ref 26.6–33.0)
MCHC: 33.6 g/dL (ref 31.5–35.7)
MCV: 93 fL (ref 79–97)
Monocytes Absolute: 0.5 10*3/uL (ref 0.1–0.9)
Monocytes: 7 %
Neutrophils Absolute: 3.3 10*3/uL (ref 1.4–7.0)
Neutrophils: 51 %
Platelets: 283 10*3/uL (ref 150–450)
RBC: 4.64 x10E6/uL (ref 3.77–5.28)
RDW: 11.6 % — ABNORMAL LOW (ref 11.7–15.4)
WBC: 6.6 10*3/uL (ref 3.4–10.8)

## 2019-07-01 LAB — LIPID PANEL W/O CHOL/HDL RATIO
Cholesterol, Total: 161 mg/dL (ref 100–199)
HDL: 41 mg/dL (ref >39)
LDL Chol Calc (NIH): 94 mg/dL (ref 0–99)
Triglycerides: 148 mg/dL (ref 0–149)
VLDL Cholesterol Cal: 26 mg/dL (ref 5–40)

## 2019-07-02 LAB — UA/M W/RFLX CULTURE, ROUTINE
Bilirubin, UA: NEGATIVE
Glucose, UA: NEGATIVE
Ketones, UA: NEGATIVE
Nitrite, UA: NEGATIVE
Protein,UA: NEGATIVE
RBC, UA: NEGATIVE
Specific Gravity, UA: 1.015 (ref 1.005–1.030)
Urobilinogen, Ur: 0.2 mg/dL (ref 0.2–1.0)
pH, UA: 6.5 (ref 5.0–7.5)

## 2019-07-02 LAB — URINE CULTURE, REFLEX

## 2019-07-02 LAB — MICROSCOPIC EXAMINATION: RBC, Urine: NONE SEEN /hpf (ref 0–2)

## 2019-08-10 ENCOUNTER — Encounter: Payer: Self-pay | Admitting: Family Medicine

## 2019-08-12 ENCOUNTER — Telehealth (INDEPENDENT_AMBULATORY_CARE_PROVIDER_SITE_OTHER): Payer: Managed Care, Other (non HMO) | Admitting: Family Medicine

## 2019-08-12 ENCOUNTER — Encounter: Payer: Self-pay | Admitting: Family Medicine

## 2019-08-12 VITALS — Ht 66.0 in | Wt 213.0 lb

## 2019-08-12 DIAGNOSIS — F339 Major depressive disorder, recurrent, unspecified: Secondary | ICD-10-CM

## 2019-08-12 DIAGNOSIS — R519 Headache, unspecified: Secondary | ICD-10-CM | POA: Diagnosis not present

## 2019-08-12 DIAGNOSIS — M62838 Other muscle spasm: Secondary | ICD-10-CM

## 2019-08-12 DIAGNOSIS — F419 Anxiety disorder, unspecified: Secondary | ICD-10-CM | POA: Diagnosis not present

## 2019-08-12 NOTE — Progress Notes (Signed)
Ht 5\' 6"  (1.676 m)   Wt 213 lb (96.6 kg)   BMI 34.38 kg/m    Subjective:    Patient ID: Ruth Bass, female    DOB: 07-15-1985, 34 y.o.   MRN: [id number]  HPI: Ruth Bass is a 34 y.o. female  Chief Complaint  Patient presents with  . Depression     pt stopped taking the effexor due to side effects  . Anxiety    . This visit was completed via WebEx due to the restrictions of the COVID-19 pandemic. All issues as above were discussed and addressed. Physical exam was done as above through visual confirmation on WebEx. If it was felt that the patient should be evaluated in the office, they were directed there. The patient verbally consented to this visit. . Location of the patient: home . Location of the provider: home . Those involved with this call:  . Provider: Merrie Roof, PA-C . CMA: Lesle Chris, [place] . Front Desk/Registration: Jill Side  . Time spent on call: 15 minutes with patient face to face via video conference. More than 50% of this time was spent in counseling and coordination of care. 5 minutes total spent in review of patient's record and preparation of their chart. I verified patient identity using two factors (patient name and date of birth). Patient consents verbally to being seen via telemedicine visit today.   Presenting today following up on some new sxs she's been having since Psychiatrist added effexor to current anxiety and depression regimen. Started with severe HAs, confusion, muscle jerking and spasms, hallucinations. Still having some muscle jerking but overall feeling much better since stopping the effexor about a week ago. Currently also on 150 mg zoloft and nortriptyline. Per patient, Psychiatrist did not feel her sxs were related to her medications and asked her to f/u with PCP for further evaluation of sxs.   Did well on 100 mg zoloft, but at increase to 150 mg on zoloft started having worsening auditory hallucinations such as hearing an  orchestra in the background. Does note the medicine helps significantly with her depression.    Depression screen [facility] 2/9 08/12/2019 06/30/2019 01/22/2019  Decreased Interest 2 3 3   Down, Depressed, Hopeless 1 2 3   PHQ - 2 Score 3 5 6   Altered sleeping 2 2 0  Tired, decreased energy 2 3 3   Change in appetite 0 1 0  Feeling bad or failure about yourself  1 1 0  Trouble concentrating 3 3 3   Moving slowly or fidgety/restless 0 1 3  Suicidal thoughts 0 1 0  PHQ-9 Score 11 17 15   Difficult doing work/chores Somewhat difficult - Very difficult   GAD 7 : Generalized Anxiety Score 08/12/2019 06/30/2019 01/22/2019 12/25/2017  Nervous, Anxious, on Edge 3 3 3 3   Control/stop worrying 3 3 3 3   Worry too much - different things 2 3 3 3   Trouble relaxing 3 3 3 2   Restless 1 1 - 1  Easily annoyed or irritable 1 0 1 1  Afraid - awful might happen 3 2 1 3   Total GAD 7 Score 16 15 - 16  Anxiety Difficulty Very difficult Very difficult Very difficult Very difficult   Relevant past medical, surgical, family and social history reviewed and updated as indicated. Interim medical history since our last visit reviewed. Allergies and medications reviewed and updated.  Review of Systems  Per HPI unless specifically indicated above     Objective:    Ht 5\' 6"  (  1.676 m)   Wt 213 lb (96.6 kg)   BMI 34.38 kg/m   Wt Readings from Last 3 Encounters:  08/12/19 213 lb (96.6 kg)  06/30/19 217 lb (98.4 kg)  04/10/19 217 lb (98.4 kg)    Physical Exam Vitals and nursing note reviewed.  Constitutional:      General: She is not in acute distress.    Appearance: Normal appearance.  HENT:     Head: Atraumatic.     Right Ear: External ear normal.     Left Ear: External ear normal.     Nose: Nose normal. No congestion.     Mouth/Throat:     Mouth: Mucous membranes are moist.     Pharynx: Oropharynx is clear. No posterior oropharyngeal erythema.  Eyes:     Extraocular Movements: Extraocular movements intact.      Conjunctiva/sclera: Conjunctivae normal.  Cardiovascular:     Comments: Unable to assess via virtual visit Pulmonary:     Effort: Pulmonary effort is normal. No respiratory distress.  Musculoskeletal:        General: Normal range of motion.     Cervical back: Normal range of motion.  Skin:    General: Skin is dry.     Findings: No erythema.  Neurological:     Mental Status: She is alert and oriented to person, place, and time.  Psychiatric:        Mood and Affect: Mood normal.        Thought Content: Thought content normal.        Judgment: Judgment normal.     Results for orders placed or performed in visit on 06/30/19  Microscopic Examination   URINE  Result Value Ref Range   WBC, UA 6-10 (A) 0 - 5 /hpf   RBC None seen 0 - 2 /hpf   Epithelial Cells (non renal) 0-10 0 - 10 /hpf   Mucus, UA Present Not Estab.   Bacteria, UA Few (A) None seen/Few  Urine Culture, Reflex   URINE  Result Value Ref Range   Urine Culture, Routine Final report    Organism ID, Bacteria Comment   CBC with Differential/Platelet out  Result Value Ref Range   WBC 6.6 3.4 - 10.8 x10E3/uL   RBC 4.64 3.77 - 5.28 x10E6/uL   Hemoglobin 14.4 11.1 - 15.9 g/dL   Hematocrit 42.9 34.0 - 46.6 %   MCV 93 79 - 97 fL   MCH 31.0 26.6 - 33.0 pg   MCHC 33.6 31.5 - 35.7 g/dL   RDW 11.6 (L) 11.7 - 15.4 %   Platelets 283 150 - 450 x10E3/uL   Neutrophils 51 Not Estab. %   Lymphs 39 Not Estab. %   Monocytes 7 Not Estab. %   Eos 2 Not Estab. %   Basos 1 Not Estab. %   Neutrophils Absolute 3.3 1.4 - 7.0 x10E3/uL   Lymphocytes Absolute 2.6 0.7 - 3.1 x10E3/uL   Monocytes Absolute 0.5 0.1 - 0.9 x10E3/uL   EOS (ABSOLUTE) 0.2 0.0 - 0.4 x10E3/uL   Basophils Absolute 0.1 0.0 - 0.2 x10E3/uL   Immature Granulocytes 0 Not Estab. %   Immature Grans (Abs) 0.0 0.0 - 0.1 x10E3/uL  Comprehensive metabolic panel  Result Value Ref Range   Glucose 87 65 - 99 mg/dL   BUN 13 6 - 20 mg/dL   Creatinine, Ser 0.70 0.57 - 1.00  mg/dL   GFR calc non Af Amer 114 >59 mL/min/1.73   GFR calc Af Amer 132 >  59 mL/min/1.73   BUN/Creatinine Ratio 19 9 - 23   Sodium 142 134 - 144 mmol/L   Potassium 4.1 3.5 - 5.2 mmol/L   Chloride 104 96 - 106 mmol/L   CO2 23 20 - 29 mmol/L   Calcium 9.3 8.7 - 10.2 mg/dL   Total Protein 6.6 6.0 - 8.5 g/dL   Albumin 4.6 3.8 - 4.8 g/dL   Globulin, Total 2.0 1.5 - 4.5 g/dL   Albumin/Globulin Ratio 2.3 (H) 1.2 - 2.2   Bilirubin Total 0.3 0.0 - 1.2 mg/dL   Alkaline Phosphatase 58 39 - 117 IU/L   AST 15 0 - 40 IU/L   ALT 12 0 - 32 IU/L  Lipid Panel w/o Chol/HDL Ratio out  Result Value Ref Range   Cholesterol, Total 161 100 - 199 mg/dL   Triglycerides 148 0 - 149 mg/dL   HDL 41 >39 mg/dL   VLDL Cholesterol Cal 26 5 - 40 mg/dL   LDL Chol Calc (NIH) 94 0 - 99 mg/dL  UA/M w/rflx Culture, Routine   Specimen: Urine   URINE  Result Value Ref Range   Specific Gravity, UA 1.015 1.005 - 1.030   pH, UA 6.5 5.0 - 7.5   Color, UA Yellow Yellow   Appearance Ur Clear Clear   Leukocytes,UA 1+ (A) Negative   Protein,UA Negative Negative/Trace   Glucose, UA Negative Negative   Ketones, UA Negative Negative   RBC, UA Negative Negative   Bilirubin, UA Negative Negative   Urobilinogen, Ur 0.2 0.2 - 1.0 mg/dL   Nitrite, UA Negative Negative   Microscopic Examination See below:    Urinalysis Reflex Comment       Assessment & Plan:   Problem List Items Addressed This Visit      Other   Depression, recurrent ([place]) - Primary    Zoloft controlling moods very well. Felt better on 100 mg dose so will restart this dose and continue to monitor closely until established with another Psychiatrist for second opinion.       Relevant Orders   Ambulatory referral to Psychiatry   Anxiety    Not under good control, but declines trying new medication at this time given her recent issues that were potentially medication related. Will refer for new Psychiatry opinion and continue zoloft and nortriptyline  regimen in meantime      Relevant Orders   Ambulatory referral to Psychiatry    Other Visit Diagnoses    Acute nonintractable headache, unspecified headache type       Constellation of sxs consistent with Serotonin Syndrome, likely from 3 serotonergic medications. Continue off effexor, decrease zoloft dose   Muscle spasm       Constellation of sxs consistent with Serotonin Syndrome, likely from 3 serotonergic medications. Continue off effexor, decrease zoloft dose       Follow up plan: Return if symptoms worsen or fail to improve.

## 2019-08-13 DIAGNOSIS — F419 Anxiety disorder, unspecified: Secondary | ICD-10-CM | POA: Insufficient documentation

## 2019-08-13 NOTE — Assessment & Plan Note (Signed)
Not under good control, but declines trying new medication at this time given her recent issues that were potentially medication related. Will refer for new Psychiatry opinion and continue zoloft and nortriptyline regimen in meantime

## 2019-08-13 NOTE — Assessment & Plan Note (Signed)
Zoloft controlling moods very well. Felt better on 100 mg dose so will restart this dose and continue to monitor closely until established with another Psychiatrist for second opinion.

## 2019-08-14 DIAGNOSIS — R42 Dizziness and giddiness: Secondary | ICD-10-CM | POA: Insufficient documentation

## 2019-08-20 ENCOUNTER — Other Ambulatory Visit: Payer: Self-pay | Admitting: Acute Care

## 2019-08-20 DIAGNOSIS — G44219 Episodic tension-type headache, not intractable: Secondary | ICD-10-CM

## 2019-08-27 ENCOUNTER — Ambulatory Visit
Admission: RE | Admit: 2019-08-27 | Discharge: 2019-08-27 | Disposition: A | Discharge status: Home / Self Care | Payer: Managed Care, Other (non HMO) | Source: Ambulatory Visit | Attending: Acute Care | Admitting: Acute Care

## 2019-08-27 ENCOUNTER — Other Ambulatory Visit: Payer: Self-pay

## 2019-08-27 DIAGNOSIS — G44219 Episodic tension-type headache, not intractable: Secondary | ICD-10-CM | POA: Insufficient documentation

## 2019-09-03 ENCOUNTER — Encounter: Payer: Self-pay | Admitting: Family Medicine

## 2019-09-11 ENCOUNTER — Other Ambulatory Visit: Payer: Self-pay | Admitting: Family Medicine

## 2019-09-11 DIAGNOSIS — H9313 Tinnitus, bilateral: Secondary | ICD-10-CM

## 2019-09-14 ENCOUNTER — Encounter: Payer: Self-pay | Admitting: Family Medicine

## 2019-09-15 ENCOUNTER — Emergency Department
Admission: EM | Admit: 2019-09-15 | Discharge: 2019-09-15 | Disposition: A | Discharge status: Home / Self Care | Payer: Managed Care, Other (non HMO) | Attending: Emergency Medicine | Admitting: Emergency Medicine

## 2019-09-15 ENCOUNTER — Encounter: Payer: Self-pay | Admitting: *Deleted

## 2019-09-15 ENCOUNTER — Emergency Department: Payer: Managed Care, Other (non HMO)

## 2019-09-15 ENCOUNTER — Other Ambulatory Visit: Payer: Self-pay

## 2019-09-15 DIAGNOSIS — R110 Nausea: Secondary | ICD-10-CM | POA: Diagnosis not present

## 2019-09-15 DIAGNOSIS — K5790 Diverticulosis of intestine, part unspecified, without perforation or abscess without bleeding: Secondary | ICD-10-CM | POA: Diagnosis not present

## 2019-09-15 DIAGNOSIS — R1031 Right lower quadrant pain: Secondary | ICD-10-CM

## 2019-09-15 DIAGNOSIS — Z79899 Other long term (current) drug therapy: Secondary | ICD-10-CM | POA: Insufficient documentation

## 2019-09-15 LAB — COMPREHENSIVE METABOLIC PANEL
ALT: 18 U/L (ref 0–44)
AST: 21 U/L (ref 15–41)
Albumin: 4.6 g/dL (ref 3.5–5.0)
Alkaline Phosphatase: 46 U/L (ref 38–126)
Anion gap: 7 (ref 5–15)
BUN: 13 mg/dL (ref 6–20)
CO2: 28 mmol/L (ref 22–32)
Calcium: 10.3 mg/dL (ref 8.9–10.3)
Chloride: 102 mmol/L (ref 98–111)
Creatinine, Ser: 0.59 mg/dL (ref 0.44–1.00)
GFR calc Af Amer: >60 mL/min (ref >60)
GFR calc non Af Amer: >60 mL/min (ref >60)
Glucose, Bld: 104 mg/dL — ABNORMAL HIGH (ref 70–99)
Potassium: 3.7 mmol/L (ref 3.5–5.1)
Sodium: 137 mmol/L (ref 135–145)
Total Bilirubin: 0.6 mg/dL (ref 0.3–1.2)
Total Protein: 7.7 g/dL (ref 6.5–8.1)

## 2019-09-15 LAB — URINALYSIS, COMPLETE (UACMP) WITH MICROSCOPIC
Bacteria, UA: NONE SEEN
Bilirubin Urine: NEGATIVE
Glucose, UA: NEGATIVE mg/dL
Ketones, ur: NEGATIVE mg/dL
Nitrite: NEGATIVE
Protein, ur: NEGATIVE mg/dL
Specific Gravity, Urine: 1.025 (ref 1.005–1.030)
pH: 6 (ref 5.0–8.0)

## 2019-09-15 LAB — CBC
HCT: 41.5 % (ref 36.0–46.0)
Hemoglobin: 14.3 g/dL (ref 12.0–15.0)
MCH: 31.2 pg (ref 26.0–34.0)
MCHC: 34.5 g/dL (ref 30.0–36.0)
MCV: 90.4 fL (ref 80.0–100.0)
Platelets: 326 10*3/uL (ref 150–400)
RBC: 4.59 MIL/uL (ref 3.87–5.11)
RDW: 11.7 % (ref 11.5–15.5)
WBC: 7.8 10*3/uL (ref 4.0–10.5)
nRBC: 0 % (ref 0.0–0.2)

## 2019-09-15 LAB — PREGNANCY, URINE: Preg Test, Ur: NEGATIVE

## 2019-09-15 LAB — LIPASE, BLOOD: Lipase: 29 U/L (ref 11–51)

## 2019-09-15 MED ORDER — MELOXICAM 15 MG PO TABS: 15.0000 mg | ORAL_TABLET | Freq: Every day | ORAL | 0 refills | Status: DC

## 2019-09-15 MED ORDER — ONDANSETRON 4 MG PO TBDP: 4.0000 mg | ORAL_TABLET | Freq: Three times a day (TID) | ORAL | 0 refills | Status: DC | PRN

## 2019-09-15 MED ORDER — KETOROLAC TROMETHAMINE 30 MG/ML IJ SOLN
15.0000 mg | Freq: Once | INTRAMUSCULAR | Status: AC
  Administered 2019-09-15: 15 mg via INTRAVENOUS
  Filled 2019-09-15: qty 1

## 2019-09-15 MED ORDER — IOHEXOL 300 MG/ML  SOLN
100.0000 mL | Freq: Once | INTRAMUSCULAR | Status: AC | PRN
  Administered 2019-09-15: 100 mL via INTRAVENOUS
  Filled 2019-09-15: qty 100

## 2019-09-15 MED ORDER — ONDANSETRON 4 MG PO TBDP
4.0000 mg | ORAL_TABLET | Freq: Once | ORAL | Status: AC
  Administered 2019-09-15: 4 mg via ORAL
  Filled 2019-09-15: qty 1

## 2019-09-15 NOTE — ED Notes (Signed)
Called CT to inquire about exam status, informed that the POC urine preg had not resulted. Lab called to place add on urine preg.

## 2019-09-15 NOTE — ED Notes (Signed)
Pt transported to CT by CT tech

## 2019-09-15 NOTE — ED Notes (Signed)
Pt reports Friday night pain began in the umbilical area, with pain migrating to the RLQ and worsening since then.

## 2019-09-15 NOTE — ED Provider Notes (Signed)
[facility] Emergency Department Provider Note ____________________________________________   First MD Initiated Contact with Patient 09/15/19 2023     (approximate)  I have reviewed the triage vital signs and the nursing notes.   HISTORY  Chief Complaint Abdominal Pain  HPI Ruth Bass is a 34 y.o. female presents to the emergency department for treatment and evaluation of abdominal pain that started on Friday. Pain initially on the right side of umbilicus but has since moved to the right lower quadrant. Symptoms have been "nagging" and persistent with an acute increase in pain with nausea this afternoon. No fever. No vomiting. She has a history of PCOS and rarely has a menstrual cycle but she did have one last week.          Past Medical History:  Diagnosis Date  . Amenorrhea    Since child, periods severely irregular. Over time periods stopped.    Patient Active Problem List   Diagnosis Date Noted  . Anxiety 08/13/2019  . Cervical spondylosis 01/22/2019  . Elevated blood pressure reading 03/27/2018  . PCOS (polycystic ovarian syndrome) 01/09/2018  . Other symptoms and signs involving the nervous system 12/25/2017  . Depression, recurrent ([place]) 08/24/2017    No past surgical history on file.  Prior to Admission medications   Medication Sig Start Date End Date Taking? Authorizing Provider  cyclobenzaprine (FLEXERIL) 10 MG tablet Take 1 tablet (10 mg total) by mouth 3 (three) times daily as needed for muscle spasms. 03/20/19   Volney American, PA-C  meloxicam (MOBIC) 15 MG tablet Take 1 tablet (15 mg total) by mouth daily. 09/15/19   Javen Hinderliter, Johnette Abraham B, FNP  Multiple Vitamin (MULTIVITAMIN) capsule Take 1 capsule by mouth daily.    [provider]  nortriptyline (PAMELOR) 10 MG capsule Take by mouth daily.  06/11/19 06/10/20  [provider]  ondansetron (ZOFRAN-ODT) 4 MG disintegrating tablet Take 1 tablet (4 mg total) by  mouth every 8 (eight) hours as needed for nausea or vomiting. 09/15/19   Jalexia Lalli, Johnette Abraham B, FNP  promethazine (PHENERGAN) 12.5 MG tablet Take by mouth as needed.  04/30/19   [provider]  sertraline (ZOLOFT) 100 MG tablet Take 100 mg by mouth daily. 06/26/19   [provider]    Allergies Duloxetine  Family History  Problem Relation Age of Onset  . Hypertension Mother   . Diabetes Father   . Heart disease Father   . Pancreatitis Father   . Mental illness Sister   . Heart disease Paternal Aunt   . Cancer Paternal Aunt        Breast  . Diabetes Paternal Uncle   . Cancer Maternal Grandmother        Brain Tumor and Colon Cancer  . Stroke Maternal Grandmother   . Stroke Maternal Grandfather   . Irritable bowel syndrome Cousin     Social History Social History   Tobacco Use  . Smoking status: Never Smoker  . Smokeless tobacco: Never Used  Substance Use Topics  . Alcohol use: No  . Drug use: No    Review of Systems  Constitutional: No fever/chills. Loss of appetite. Eyes: No visual changes. ENT: No sore throat. Cardiovascular: Denies chest pain. Respiratory: Denies shortness of breath. Gastrointestinal: Positive forabdominal pain. Positive for nausea, no vomiting.  No diarrhea.  No constipation. Genitourinary: Negative for dysuria. Musculoskeletal: Negative for back pain. Skin: Negative for rash. Neurological: Negative for headaches, focal weakness or numbness. ____________________________________________   PHYSICAL EXAM:  VITAL SIGNS: ED Triage Vitals  Enc Vitals Group     BP 09/15/19 1908 (!) 176/100     Pulse Rate 09/15/19 1908 82     Resp 09/15/19 1908 16     Temp 09/15/19 1908 98.1 F (36.7 C)     Temp Source 09/15/19 1908 Oral     SpO2 09/15/19 1908 96 %     Weight 09/15/19 1910 220 lb (99.8 kg)     Height 09/15/19 1910 5\' 6"  (1.676 m)     Head Circumference --      [place] Flow --      Pain Score 09/15/19 1919 7     Pain Loc --       Pain Edu? --      Excl. in [place]? --     Constitutional: Alert and oriented. Well appearing and in no acute distress. Eyes: Conjunctivae are normal. Head: Atraumatic. Nose: No congestion/rhinnorhea. Mouth/Throat: Mucous membranes are moist.  Oropharynx non-erythematous. Neck: No stridor.   Hematological/Lymphatic/Immunilogical: No cervical lymphadenopathy. Cardiovascular: Normal rate, regular rhythm. Grossly normal heart sounds.  Good peripheral circulation. Respiratory: Normal respiratory effort.  No retractions. Lungs CTAB. Gastrointestinal: Soft and nontender. No distention. No abdominal bruits. No CVA tenderness. Genitourinary:  Musculoskeletal: No lower extremity tenderness nor edema.  No joint effusions. Neurologic:  Normal speech and language. No gross focal neurologic deficits are appreciated. No gait instability. Skin:  Skin is warm, dry and intact. No rash noted. Psychiatric: Mood and affect are normal. Speech and behavior are normal.  ____________________________________________   LABS (all labs ordered are listed, but only abnormal results are displayed)  Labs Reviewed  COMPREHENSIVE METABOLIC PANEL - Abnormal; Notable for the following components:      Result Value   Glucose, Bld 104 (*)    All other components within normal limits  URINALYSIS, COMPLETE (UACMP) WITH MICROSCOPIC - Abnormal; Notable for the following components:   Hgb urine dipstick TRACE (*)    Leukocytes,Ua TRACE (*)    All other components within normal limits  LIPASE, BLOOD  CBC  PREGNANCY, URINE  POC URINE PREG, ED   ____________________________________________  EKG  Not indicated. ____________________________________________  RADIOLOGY  ED MD interpretation:    CT negative for appendicitis. Diverticulosis noted, otherwise normal CT.  Official radiology report(s): CT ABDOMEN PELVIS W CONTRAST  Result Date: 09/15/2019 CLINICAL DATA:  Right lower quadrant abdominal pain. EXAM: CT  ABDOMEN AND PELVIS WITH CONTRAST TECHNIQUE: Multidetector CT imaging of the abdomen and pelvis was performed using the standard protocol following bolus administration of intravenous contrast. CONTRAST:  163mL OMNIPAQUE IOHEXOL 300 MG/ML  SOLN COMPARISON:  None. FINDINGS: Lower chest: The lung bases are clear. The heart size is normal. Hepatobiliary: The liver is normal. Normal gallbladder.There is no biliary ductal dilation. Pancreas: Normal contours without ductal dilatation. No peripancreatic fluid collection. Spleen: No splenic laceration or hematoma. Adrenals/Urinary Tract: --Adrenal glands: No adrenal hemorrhage. --Right kidney/ureter: No hydronephrosis or perinephric hematoma. --Left kidney/ureter: No hydronephrosis or perinephric hematoma. --Urinary bladder: The bladder is decompressed and therefore poorly evaluated. Stomach/Bowel: --Stomach/Duodenum: No hiatal hernia or other gastric abnormality. Normal duodenal course and caliber. --Small bowel: No dilatation or inflammation. --Colon: There is scattered colonic diverticula without CT evidence for diverticulitis. --Appendix: Normal. Vascular/Lymphatic: Normal course and caliber of the major abdominal vessels. --No retroperitoneal lymphadenopathy. --No mesenteric lymphadenopathy. --No pelvic or inguinal lymphadenopathy. Reproductive: Unremarkable Other: No ascites or free air. The abdominal wall is normal. Musculoskeletal. No acute displaced fractures. IMPRESSION: 1. Scattered colonic diverticula  without evidence of diverticulitis. 2. Normal appendix. 3. No hydronephrosis. Electronically Signed   By: Constance Holster M.D.   On: 09/15/2019 23:15    ____________________________________________   PROCEDURES  Procedure(s) performed (including Critical Care):  Procedures  ____________________________________________   INITIAL IMPRESSION / ASSESSMENT AND PLAN     34 year old female presenting to the emergency department for treatment and  evaluation of right lower quadrant abdominal pain with nausea that started approximately 3 days ago.  See HPI for further details.  While awaiting ER room assignment, protocol labs were drawn and are all reassuring.  White blood cell count is normal at 7.8.  Lipase is normal at 29.  Urinalysis is not consistent with acute cystitis.  Exam is concerning for appendicitis.  Plan will be to obtain a CT of the abdomen and pelvis with contrast.  Patient denies contrast allergy.  DIFFERENTIAL DIAGNOSIS  Appendicitis, ovarian cyst rupture, colitis, diverticulitis  ED COURSE  CT negative for acute abdomen. Diverticulosis noted. She will be prescribed meloxicam and zofran for symptom management. She is to follow up with GI for symptoms that are not improving with medications. She is to return to the ER for symptoms that change or worsen if unable to schedule an appointment. ____________________________________________   FINAL CLINICAL IMPRESSION(S) / ED DIAGNOSES  Final diagnoses:  Diverticulosis  Abdominal pain, acute, right lower quadrant     ED Discharge Orders         Ordered    meloxicam (MOBIC) 15 MG tablet  Daily     09/15/19 2337    ondansetron (ZOFRAN-ODT) 4 MG disintegrating tablet  Every 8 hours PRN     09/15/19 2337           Keishia Sherburne was evaluated in Emergency Department on 09/15/2019 for the symptoms described in the history of present illness. She was evaluated in the context of the global COVID-19 pandemic, which necessitated consideration that the patient might be at risk for infection with the SARS-CoV-2 virus that causes COVID-19. Institutional protocols and algorithms that pertain to the evaluation of patients at risk for COVID-19 are in a state of rapid change based on information released by regulatory bodies including the CDC and federal and state organizations. These policies and algorithms were followed during the patient's care in the ED.   Note:  This document  was prepared using Dragon voice recognition software and may include unintentional dictation errors.   Victorino Dike, FNP 09/15/19 [id number]    Duffy Bruce, MD 09/16/19 [phone]

## 2019-09-15 NOTE — Discharge Instructions (Addendum)
Please follow up with the GI specialist for pain that does not resolve with medication after a few days.  Return to the ER for symptoms that change or worsen if unable to schedule an appointment.

## 2019-09-15 NOTE — ED Triage Notes (Signed)
Pt has right lower abd pain with nausea.  No v/d.  No back pain  No urinary sx.  No vag bleeding.  Pt alert  Speech clear.   Pt sent from urgent care for eval of appendicitis.

## 2019-09-16 ENCOUNTER — Other Ambulatory Visit: Payer: Self-pay | Admitting: Family Medicine

## 2019-09-16 MED ORDER — SERTRALINE HCL 100 MG PO TABS: 100.0000 mg | ORAL_TABLET | Freq: Every day | ORAL | 1 refills | Status: DC

## 2019-09-24 ENCOUNTER — Other Ambulatory Visit: Payer: Self-pay

## 2019-09-24 MED ORDER — SERTRALINE HCL 100 MG PO TABS: 100.0000 mg | ORAL_TABLET | Freq: Every day | ORAL | 1 refills | Status: DC

## 2019-09-24 NOTE — Telephone Encounter (Signed)
Fax from [facility]. New prescription request for Sertraline.

## 2019-10-03 ENCOUNTER — Ambulatory Visit: Payer: Self-pay | Attending: Internal Medicine

## 2019-10-03 DIAGNOSIS — Z23 Encounter for immunization: Secondary | ICD-10-CM

## 2019-10-03 NOTE — Progress Notes (Signed)
   Covid-19 Vaccination Clinic  Name:  Ruth Bass    MRN: [id number] DOB: 01-Jul-1986  10/03/2019  [name] was observed post Covid-19 immunization for 15 minutes without incident. She was provided with Vaccine Information Sheet and instruction to access the V-Safe system.   [name] was instructed to call 911 with any severe reactions post vaccine: Marland Kitchen Difficulty breathing  . Swelling of face and throat  . A fast heartbeat  . A bad rash all over body  . Dizziness and weakness   Immunizations Administered    Name Date Dose VIS Date Route   Pfizer COVID-19 Vaccine 10/03/2019  4:03 PM 0.3 mL 06/20/2019 Intramuscular   Manufacturer: [place]   Lot: [id number]   [place]: [id number]

## 2019-10-28 ENCOUNTER — Ambulatory Visit: Payer: Self-pay | Attending: Internal Medicine

## 2019-10-28 DIAGNOSIS — Z23 Encounter for immunization: Secondary | ICD-10-CM

## 2019-10-28 NOTE — Progress Notes (Signed)
   Covid-19 Vaccination Clinic  Name:  Ruth Bass    MRN: [id number] DOB: June 11, 1986  10/28/2019  Ruth Bass was observed post Covid-19 immunization for 15 minutes without incident. She was provided with Vaccine Information Sheet and instruction to access the V-Safe system.   Ruth Bass was instructed to call 911 with any severe reactions post vaccine: Marland Kitchen Difficulty breathing  . Swelling of face and throat  . A fast heartbeat  . A bad rash all over body  . Dizziness and weakness   Immunizations Administered    Name Date Dose VIS Date Route   Pfizer COVID-19 Vaccine 10/28/2019  4:34 PM 0.3 mL 09/03/2018 Intramuscular   Manufacturer: [place]   Lot: [id number]   [place]: [id number]

## 2020-02-12 ENCOUNTER — Encounter: Payer: Self-pay | Admitting: Family Medicine

## 2020-02-25 ENCOUNTER — Ambulatory Visit: Payer: Self-pay | Admitting: Family Medicine

## 2020-02-27 ENCOUNTER — Encounter: Payer: Self-pay | Admitting: Family Medicine

## 2020-02-27 ENCOUNTER — Other Ambulatory Visit: Payer: Self-pay

## 2020-02-27 ENCOUNTER — Ambulatory Visit: Payer: Managed Care, Other (non HMO) | Admitting: Family Medicine

## 2020-02-27 VITALS — BP 138/90 | HR 70 | Temp 97.9°F | Wt 239.0 lb

## 2020-02-27 DIAGNOSIS — F419 Anxiety disorder, unspecified: Secondary | ICD-10-CM | POA: Diagnosis not present

## 2020-02-27 DIAGNOSIS — F339 Major depressive disorder, recurrent, unspecified: Secondary | ICD-10-CM

## 2020-02-27 MED ORDER — SERTRALINE HCL 100 MG PO TABS: 100.0000 mg | ORAL_TABLET | Freq: Every day | ORAL | 1 refills | Status: DC

## 2020-02-27 NOTE — Progress Notes (Signed)
BP 138/90   Pulse 70   Temp 97.9 F (36.6 C) (Oral)   Wt 239 lb (108.4 kg)   SpO2 97%   BMI 38.58 kg/m    Subjective:    Patient ID: Ruth Bass, female    DOB: Jun 19, 1986, 34 y.o.   MRN: [id number]  HPI: Ruth Bass is a 34 y.o. female  Chief Complaint  Patient presents with  . Anxiety  . Depression   Here today for 6 month f/u anxiety and depression. Significant benefit with zoloft, tolerating well without side effects. Denies SI/HI, sleep, appetite concerns, severe mood swings, frequent panic episodes. No new concerns today, overall doing very well.   Depression screen [facility] 2/9 02/27/2020 08/12/2019 06/30/2019  Decreased Interest 1 2 3   Down, Depressed, Hopeless 1 1 2   PHQ - 2 Score 2 3 5   Altered sleeping 2 2 2   Tired, decreased energy 2 2 3   Change in appetite 0 0 1  Feeling bad or failure about yourself  0 1 1  Trouble concentrating 3 3 3   Moving slowly or fidgety/restless 1 0 1  Suicidal thoughts 0 0 1  PHQ-9 Score 10 11 17   Difficult doing work/chores - Somewhat difficult -   GAD 7 : Generalized Anxiety Score 02/27/2020 08/12/2019 06/30/2019 01/22/2019  Nervous, Anxious, on Edge 3 3 3 3   Control/stop worrying 1 3 3 3   Worry too much - different things 1 2 3 3   Trouble relaxing 3 3 3 3   Restless 0 1 1 -  Easily annoyed or irritable 0 1 0 1  Afraid - awful might happen 0 3 2 1   Total GAD 7 Score 8 16 15  -  Anxiety Difficulty Somewhat difficult Very difficult Very difficult Very difficult   Relevant past medical, surgical, family and social history reviewed and updated as indicated. Interim medical history since our last visit reviewed. Allergies and medications reviewed and updated.  Review of Systems  Per HPI unless specifically indicated above     Objective:    BP 138/90   Pulse 70   Temp 97.9 F (36.6 C) (Oral)   Wt 239 lb (108.4 kg)   SpO2 97%   BMI 38.58 kg/m   Wt Readings from Last 3 Encounters:  02/27/20 239 lb (108.4 kg)  09/15/19  220 lb (99.8 kg)  08/12/19 213 lb (96.6 kg)    Physical Exam Vitals and nursing note reviewed.  Constitutional:      Appearance: Normal appearance. She is not ill-appearing.  HENT:     Head: Atraumatic.  Eyes:     Extraocular Movements: Extraocular movements intact.     Conjunctiva/sclera: Conjunctivae normal.  Cardiovascular:     Rate and Rhythm: Normal rate and regular rhythm.     Heart sounds: Normal heart sounds.  Pulmonary:     Effort: Pulmonary effort is normal.     Breath sounds: Normal breath sounds.  Musculoskeletal:        General: Normal range of motion.     Cervical back: Normal range of motion and neck supple.  Skin:    General: Skin is warm and dry.  Neurological:     Mental Status: She is alert and oriented to person, place, and time.  Psychiatric:        Mood and Affect: Mood normal.        Thought Content: Thought content normal.        Judgment: Judgment normal.     Results for orders placed or  performed during the hospital encounter of 09/15/19  Lipase, blood  Result Value Ref Range   Lipase 29 11 - 51 U/L  Comprehensive metabolic panel  Result Value Ref Range   Sodium 137 135 - 145 mmol/L   Potassium 3.7 3.5 - 5.1 mmol/L   Chloride 102 98 - 111 mmol/L   CO2 28 22 - 32 mmol/L   Glucose, Bld 104 (H) 70 - 99 mg/dL   BUN 13 6 - 20 mg/dL   Creatinine, Ser 0.59 0.44 - 1.00 mg/dL   Calcium 10.3 8.9 - 10.3 mg/dL   Total Protein 7.7 6.5 - 8.1 g/dL   Albumin 4.6 3.5 - 5.0 g/dL   AST 21 15 - 41 U/L   ALT 18 0 - 44 U/L   Alkaline Phosphatase 46 38 - 126 U/L   Total Bilirubin 0.6 0.3 - 1.2 mg/dL   GFR calc non Af Amer >60 >60 mL/min   GFR calc Af Amer >60 >60 mL/min   Anion gap 7 5 - 15  CBC  Result Value Ref Range   WBC 7.8 4.0 - 10.5 K/uL   RBC 4.59 3.87 - 5.11 MIL/uL   Hemoglobin 14.3 12.0 - 15.0 g/dL   HCT 41.5 36 - 46 %   MCV 90.4 80.0 - 100.0 fL   MCH 31.2 26.0 - 34.0 pg   MCHC 34.5 30.0 - 36.0 g/dL   RDW 11.7 11.5 - 15.5 %   Platelets 326  150 - 400 K/uL   nRBC 0.0 0.0 - 0.2 %  Urinalysis, Complete w Microscopic  Result Value Ref Range   Color, Urine YELLOW YELLOW   APPearance CLEAR CLEAR   Specific Gravity, Urine 1.025 1.005 - 1.030   pH 6.0 5.0 - 8.0   Glucose, UA NEGATIVE NEGATIVE mg/dL   Hgb urine dipstick TRACE (A) NEGATIVE   Bilirubin Urine NEGATIVE NEGATIVE   Ketones, ur NEGATIVE NEGATIVE mg/dL   Protein, ur NEGATIVE NEGATIVE mg/dL   Nitrite NEGATIVE NEGATIVE   Leukocytes,Ua TRACE (A) NEGATIVE   Squamous Epithelial / LPF 0-5 0 - 5   WBC, UA 6-10 0 - 5 WBC/hpf   RBC / HPF 0-5 0 - 5 RBC/hpf   Bacteria, UA NONE SEEN NONE SEEN  Pregnancy, urine  Result Value Ref Range   Preg Test, Ur NEGATIVE NEGATIVE      Assessment & Plan:   Problem List Items Addressed This Visit      Other   Depression, recurrent (HCC)    Chronic, stable and well controlled on zoloft, continue current regimen      Relevant Medications   sertraline (ZOLOFT) 100 MG tablet   Anxiety - Primary    Chronic, stable and well controlled on zoloft, continue current regimen      Relevant Medications   sertraline (ZOLOFT) 100 MG tablet       Follow up plan: Return in about 6 months (around 08/29/2020) for CPE.

## 2020-02-28 NOTE — Assessment & Plan Note (Signed)
Chronic, stable and well controlled on zoloft, continue current regimen

## 2020-03-20 ENCOUNTER — Other Ambulatory Visit: Payer: Self-pay | Admitting: Family Medicine

## 2020-03-20 NOTE — Telephone Encounter (Signed)
Requested Prescriptions  Pending Prescriptions Disp Refills  . sertraline (ZOLOFT) 100 MG tablet [Pharmacy Med Name: SERTRALINE HCL 100MG  TABLET] 90 tablet 1    Sig: TAKE 1 TABLET BY MOUTH  DAILY     Psychiatry:  Antidepressants - SSRI Passed - 03/20/2020 11:03 PM      Passed - Completed PHQ-2 or PHQ-9 in the last 360 days.      Passed - Valid encounter within last 6 months    Recent Outpatient Visits          3 weeks ago [place], [place]   7 months ago Depression, recurrent [facility])   [place], [place], [place]   8 months ago Annual physical exam   [facility] Merrie Roof [place], [place]   11 months ago Joint instability   [facility] Merrie Roof [place], [place]   1 year ago Dizziness   [place], [place], [place]             Previous refill by Merrie Roof, PA-C on 02/27/20 was submitted to [facility] # [id number] in [place], [place].  This refill request is from [facility] order pharmacy.  Merrie Roof, PA-C's last day with practice was on 03/05/2020.

## 2020-08-12 IMAGING — MR MR HEAD W/O CM
11 series · 42 of 48 positions shown · non-contrast
Comparison: Brain MRI 08/07/2017.

CLINICAL DATA: Episodic tension-type headache, not intractable.
Additional history provided by technologist: Patient reports chronic
headaches past 3 weeks, constant but increase in intensity
intermittently during the day, be in [DATE], auditory
hallucinations, right-sided hearing loss, right orbital pressure
with occasional spots and flashes of light for the past 2 weeks.

EXAM:
MRI HEAD WITHOUT CONTRAST
TECHNIQUE: Multiplanar, multiecho pulse sequences of the brain and surrounding
structures were obtained without intravenous contrast.

[Series 5: ax dwi_tracew · axial · 3.0mm · 0.60mm/px · z∈[-91,+61]mm · 4 of 48 slices shown]
[im 1/48]
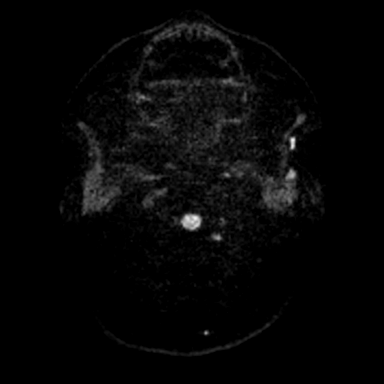
[im 16/48]
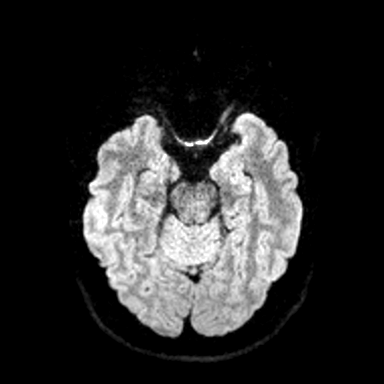
[im 32/48]
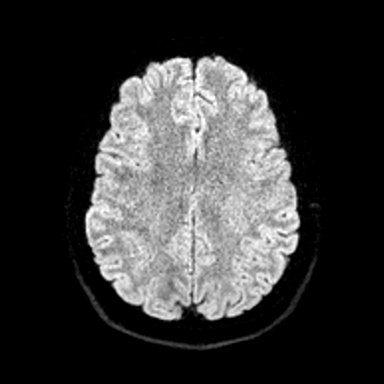
[im 48/48]
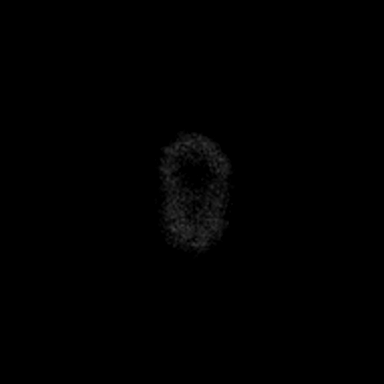

[Series 6: ax dwi_adc · axial · 3.0mm · 0.60mm/px · z∈[-91,+61]mm · 4 of 48 slices shown]
[im 1/48]
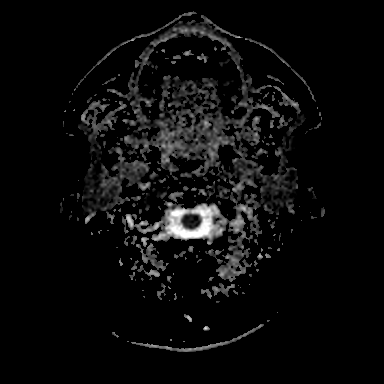
[im 16/48]
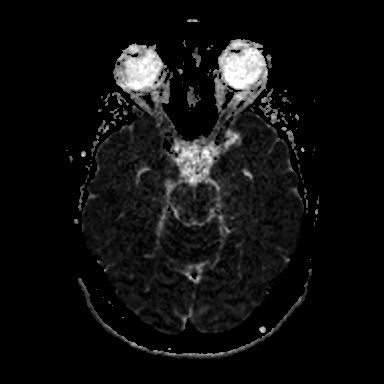
[im 32/48]
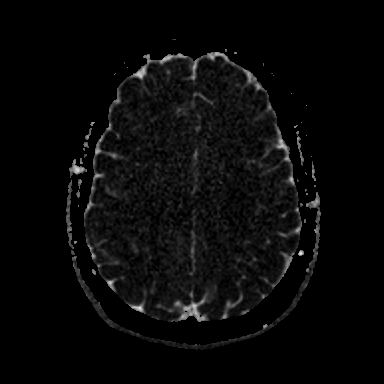
[im 48/48]
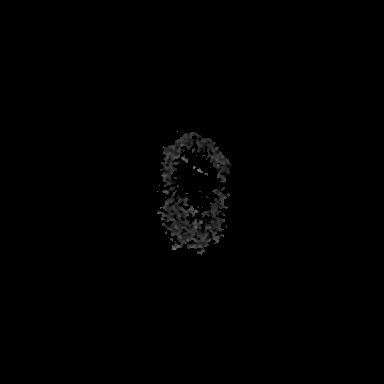

[Series 7: cor dwi_tracew · coronal · 5.0mm · 0.60mm/px · 3 of 40 slices shown]
[im 1/40]
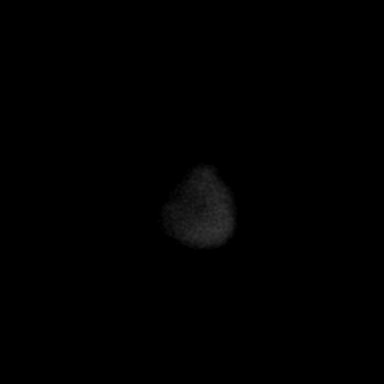
[im 20/40]
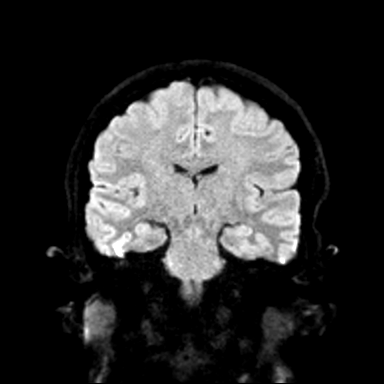
[im 40/40]
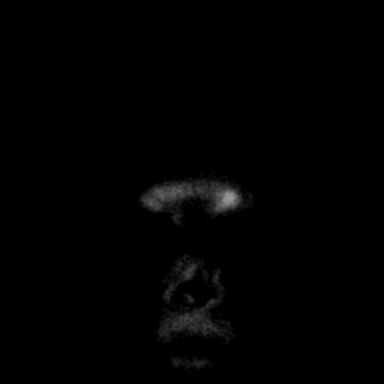

[Series 8: cor dwi_adc · coronal · 5.0mm · 0.60mm/px · 3 of 40 slices shown]
[im 1/40]
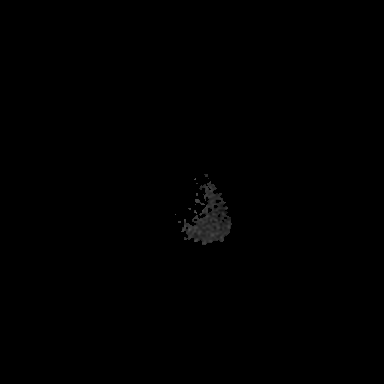
[im 20/40]
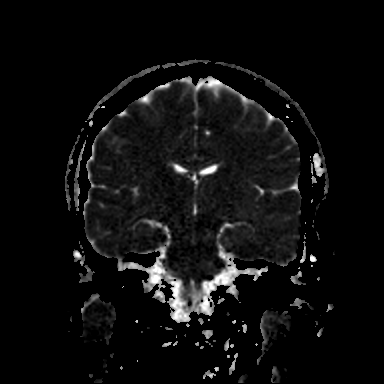
[im 40/40]
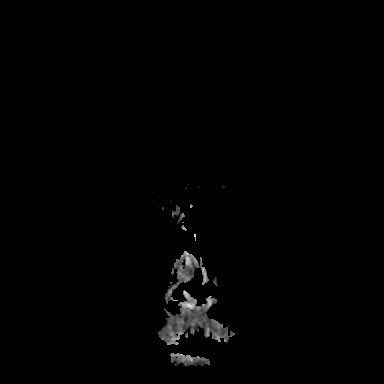

[Series 9: T1 · sagittal · 5.0mm · 0.62mm/px · 2 of 25 slices shown (1 of 2)]
[im 1/25]
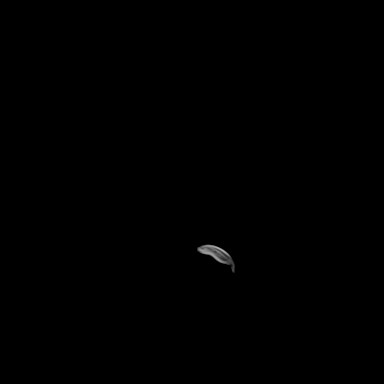
[im 25/25]
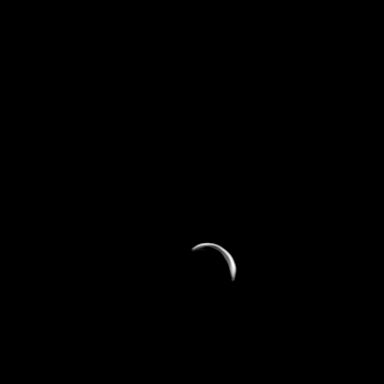

[Series 10: T2 · axial · 5.0mm · 0.53mm/px · z∈[-92,+61]mm · 2 of 27 slices shown (1 of 2)]
[im 1/27]
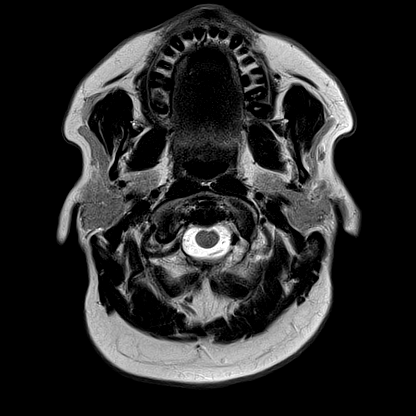
[im 27/27]
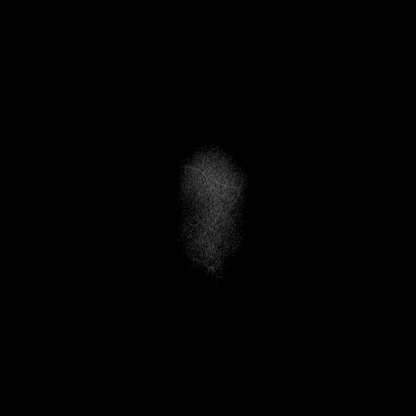

[Series 12: pha_images · axial · 3.0mm · 0.90mm/px · z∈[-101,+73]mm · 5 of 58 slices shown]
[im 1/58]
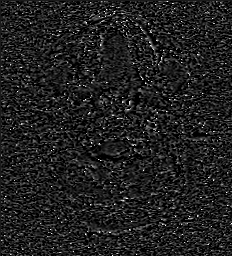
[im 15/58]
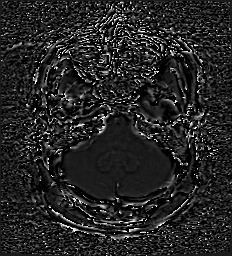
[im 29/58]
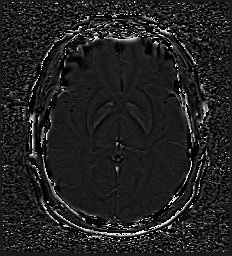
[im 43/58]
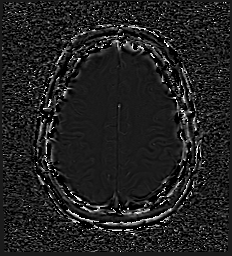
[im 58/58]
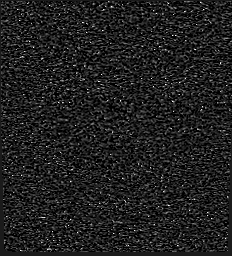

[Series 13: swi_images · axial · 3.0mm · 0.90mm/px · z∈[-101,+73]mm · 5 of 60 slices shown]
[im 1/60]
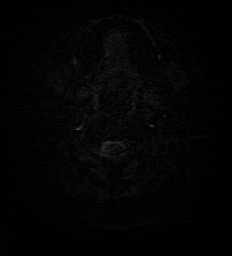
[im 15/60]
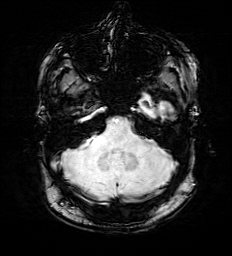
[im 30/60]
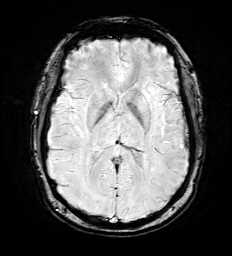
[im 45/60]
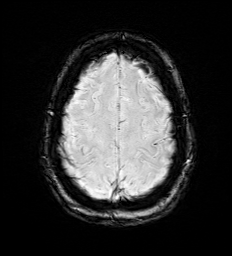
[im 60/60]
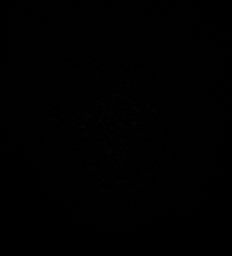

[Series 15: FLAIR · axial · 3.0mm · 0.53mm/px · z∈[-95,+64]mm · 4 of 55 slices shown]
[im 1/55]
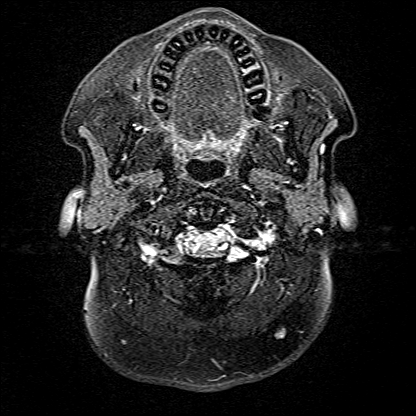
[im 19/55]
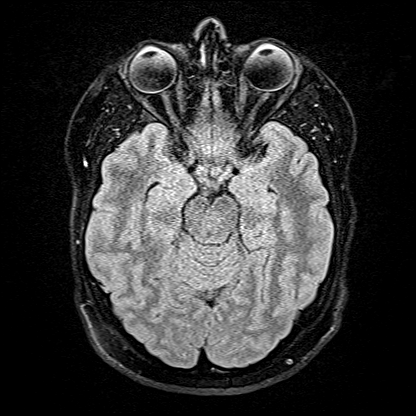
[im 37/55]
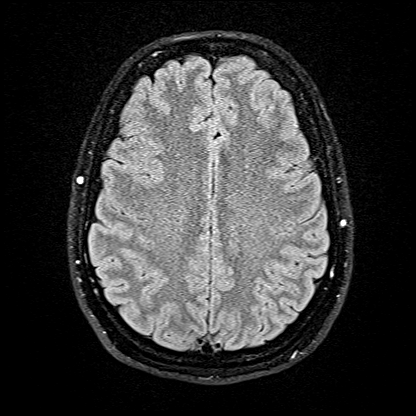
[im 55/55]
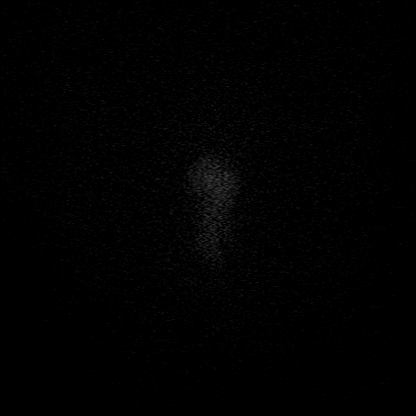

[Series 16: T1 · axial · 1.0mm · 0.98mm/px · z∈[-99,+73]mm · 8 of 176 slices shown (2 of 2)]
[im 1/176]
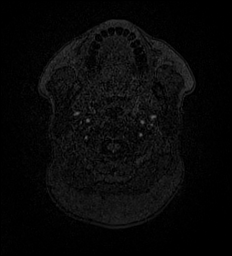
[im 27/176]
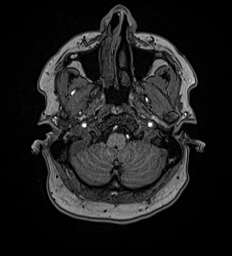
[im 54/176]
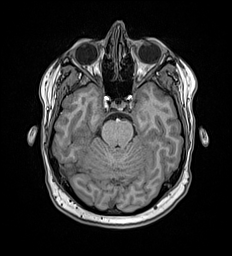
[im 81/176]
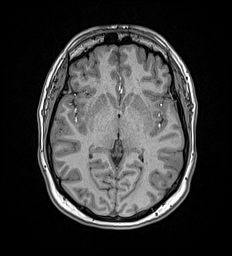
[im 95/176]
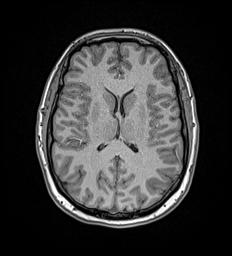
[im 122/176]
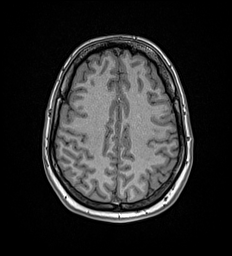
[im 149/176]
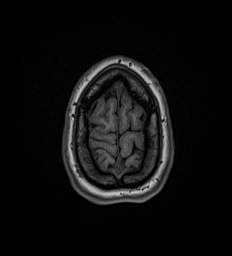
[im 176/176]
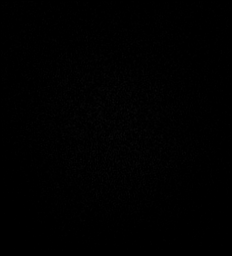

[Series 17: T2 · coronal · 5.0mm · 0.57mm/px · 2 of 29 slices shown (2 of 2)]
[im 1/29]
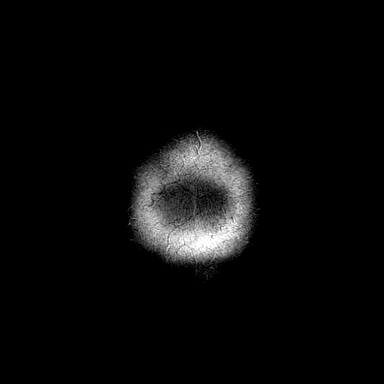
[im 29/29]
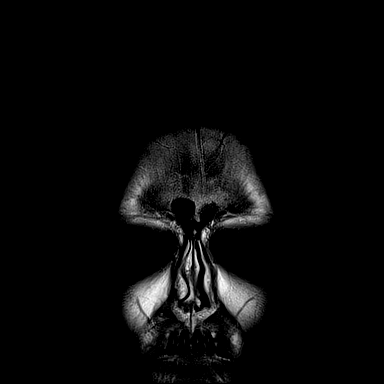

[42 of 48 positions shown; findings below may reference images not displayed]

FINDINGS: Brain:

There is no evidence of acute infarct.

No evidence of intracranial mass.

No midline shift or extra-axial fluid collection.

Redemonstrated punctate focus of SWI signal loss within the left
frontal lobe white matter, which may reflect a small focus of
mineralization or chronic microhemorrhage (series 13, image 39).

Unchanged from prior examination, there are a few tiny scattered
nonspecific foci of T2/FLAIR hyperintensity within the cerebral
white matter.

Cerebral volume is normal for age.

Vascular: Flow voids maintained within the proximal large arterial
vessels. Redemonstrated developmental venous anomaly within the left
cerebellar hemisphere.

Skull and upper cervical spine: Normal marrow signal.

Sinuses/Orbits: Visualized orbits demonstrate no acute abnormality.
Minimal ethmoid sinus mucosal thickening. No significant mastoid
effusion.
IMPRESSION: 1. Unchanged MRI appearance of the brain as compared to 08/07/2017.
2. No evidence of acute intracranial abnormality.
3. A few tiny scattered foci of T2 hyperintensity within the
cerebral white matter are stable and consistent with sequela of a
nonspecific remote insult.
4. Punctate focus of mineralization versus chronic microhemorrhage
within the left frontal lobe.

## 2020-09-04 ENCOUNTER — Other Ambulatory Visit: Payer: Self-pay | Admitting: Family Medicine

## 2020-09-04 NOTE — Telephone Encounter (Signed)
Courtesy refill - Patient needs office visit. Requested Prescriptions  Pending Prescriptions Disp Refills  . sertraline (ZOLOFT) 100 MG tablet [Pharmacy Med Name: Sertraline HCl 100 MG Oral Tablet] 30 tablet 0    Sig: TAKE 1 TABLET BY MOUTH  DAILY     Psychiatry:  Antidepressants - SSRI Failed - 09/04/2020 10:31 PM      Failed - Valid encounter within last 6 months    Recent Outpatient Visits          6 months ago Anxiety   [facility] Volney American, [place]   1 year ago Depression, recurrent [facility])   [facility] Volney American, [place]   1 year ago Annual physical exam   [facility] Volney American, [place]   1 year ago Joint instability   [facility] Merrie Roof [place], [place]   1 year ago Dizziness   [place], [place], [place]             Passed - Completed PHQ-2 or PHQ-9 in the last 360 days

## 2020-10-20 ENCOUNTER — Other Ambulatory Visit: Payer: Self-pay | Admitting: Family Medicine

## 2020-10-20 NOTE — Telephone Encounter (Signed)
Requested medications are due for refill today.  Yes  Requested medications are on the active medications list.  yes  Last refill. 09/04/2020  Future visit scheduled.   no  Notes to clinic.  Courtesy refill already given.

## 2020-10-21 NOTE — Telephone Encounter (Signed)
Patient will receive more refills once her appointment in may is complete.  Patient was told to return in February.

## 2020-10-21 NOTE — Telephone Encounter (Signed)
Pt last apt on 02/27/2020

## 2020-10-21 NOTE — Telephone Encounter (Signed)
Pt is scheduled for 11/26/2020

## 2020-11-17 ENCOUNTER — Other Ambulatory Visit: Payer: Self-pay | Admitting: Nurse Practitioner

## 2020-11-17 NOTE — Telephone Encounter (Signed)
Requested Prescriptions  Pending Prescriptions Disp Refills  . sertraline (ZOLOFT) 100 MG tablet [Pharmacy Med Name: Sertraline HCl 100 MG Oral Tablet] 90 tablet 0    Sig: TAKE 1 TABLET BY MOUTH  DAILY     Psychiatry:  Antidepressants - SSRI Failed - 11/17/2020 11:26 PM      Failed - Valid encounter within last 6 months    Recent Outpatient Visits          8 months ago Anxiety   [facility] [place], [place]   1 year ago Depression, recurrent [facility])   [facility], [place]   1 year ago Annual physical exam   [facility] [facility], [place]   1 year ago Joint instability   [facility] Ro[place], [place]   1 year ago Dizziness   [place], Lilia Argue, [place]      Future Appointments            In 1 week Vigg, Avanti, MD [facility]           Passed - Completed PHQ-2 or PHQ-9 in the last 360 days

## 2020-11-26 ENCOUNTER — Ambulatory Visit: Payer: Managed Care, Other (non HMO) | Admitting: Internal Medicine

## 2020-11-26 ENCOUNTER — Ambulatory Visit: Payer: Managed Care, Other (non HMO) | Admitting: Nurse Practitioner

## 2020-12-01 ENCOUNTER — Ambulatory Visit: Payer: Managed Care, Other (non HMO) | Admitting: Internal Medicine

## 2021-01-21 ENCOUNTER — Encounter: Payer: Self-pay | Admitting: Nurse Practitioner

## 2021-01-21 ENCOUNTER — Other Ambulatory Visit: Payer: Self-pay

## 2021-01-21 ENCOUNTER — Ambulatory Visit (INDEPENDENT_AMBULATORY_CARE_PROVIDER_SITE_OTHER): Payer: Managed Care, Other (non HMO) | Admitting: Nurse Practitioner

## 2021-01-21 VITALS — BP 133/85 | HR 64 | Temp 98.1°F | Ht 66.0 in | Wt 266.8 lb

## 2021-01-21 DIAGNOSIS — Z114 Encounter for screening for human immunodeficiency virus [HIV]: Secondary | ICD-10-CM

## 2021-01-21 DIAGNOSIS — F339 Major depressive disorder, recurrent, unspecified: Secondary | ICD-10-CM | POA: Diagnosis not present

## 2021-01-21 DIAGNOSIS — Z1159 Encounter for screening for other viral diseases: Secondary | ICD-10-CM

## 2021-01-21 DIAGNOSIS — Z0000 Encounter for general adult medical examination without abnormal findings: Secondary | ICD-10-CM

## 2021-01-21 DIAGNOSIS — F419 Anxiety disorder, unspecified: Secondary | ICD-10-CM

## 2021-01-21 LAB — URINALYSIS, ROUTINE W REFLEX MICROSCOPIC
Bilirubin, UA: NEGATIVE
Glucose, UA: NEGATIVE
Ketones, UA: NEGATIVE
Nitrite, UA: NEGATIVE
Protein,UA: NEGATIVE
RBC, UA: NEGATIVE
Specific Gravity, UA: 1.01 (ref 1.005–1.030)
Urobilinogen, Ur: 0.2 mg/dL (ref 0.2–1.0)
pH, UA: 6 (ref 5.0–7.5)

## 2021-01-21 LAB — MICROSCOPIC EXAMINATION
Bacteria, UA: NONE SEEN
RBC, Urine: NONE SEEN /hpf (ref 0–2)

## 2021-01-21 MED ORDER — MECLIZINE HCL 25 MG PO TABS: 25.0000 mg | ORAL_TABLET | Freq: Three times a day (TID) | ORAL | 1 refills | Status: DC | PRN

## 2021-01-21 NOTE — Assessment & Plan Note (Signed)
Chronic.  Controlled.  Continue with current medication regimen.  Labs ordered today.  Return to clinic in 6 months for reevaluation.  Call sooner if concerns arise.  ? ?

## 2021-01-21 NOTE — Progress Notes (Signed)
BP 133/85   Pulse 64   Temp 98.1 F (36.7 C)   Ht 5\' 6"  (1.676 m)   Wt 266 lb 12.8 oz (121 kg)   SpO2 98%   BMI 43.06 kg/m    Subjective:    Patient ID: Ruth Bass, female    DOB: April 26, 1986, 35 y.o.   MRN: [id number]  HPI: Ruth Bass is a 35 y.o. female presenting on 01/21/2021 for comprehensive medical examination. Current medical complaints include:none  She currently lives with: Menopausal Symptoms: no  DEPRESSION/ANXIETY Patient states they are better than they used to be. The Zoloft has helped a lot. She feels like they are still there but have improved.  Not sure if she would like to increase the dose of the medication at this time.  Denies SI.  Depression Screen done today and results listed below:  Depression screen [facility] 2/9 01/21/2021 02/27/2020 08/12/2019 06/30/2019 01/22/2019  Decreased Interest 1 1 2 3 3   Down, Depressed, Hopeless 1 1 1 2 3   PHQ - 2 Score 2 2 3 5 6   Altered sleeping 1 2 2 2  0  Tired, decreased energy 1 2 2 3 3   Change in appetite 1 0 0 1 0  Feeling bad or failure about yourself  0 0 1 1 0  Trouble concentrating 2 3 3 3 3   Moving slowly or fidgety/restless 1 1 0 1 3  Suicidal thoughts 0 0 0 1 0  PHQ-9 Score 8 10 11 17 15   Difficult doing work/chores Somewhat difficult - Somewhat difficult - Very difficult   GAD 7 : Generalized Anxiety Score 01/21/2021 02/27/2020 08/12/2019 06/30/2019  Nervous, Anxious, on Edge 2 3 3 3   Control/stop worrying 1 1 3 3   Worry too much - different things 1 1 2 3   Trouble relaxing 2 3 3 3   Restless 1 0 1 1  Easily annoyed or irritable 0 0 1 0  Afraid - awful might happen 1 0 3 2  Total GAD 7 Score 8 8 16 15   Anxiety Difficulty Somewhat difficult Somewhat difficult Very difficult Very difficult     Denies HA, CP, SOB, dizziness, palpitations, visual changes, and lower extremity swelling.   The patient does not have a history of falls. I did complete a risk assessment for falls. A plan of care for falls was  documented.   Past Medical History:  Past Medical History:  Diagnosis Date   Amenorrhea    Since child, periods severely irregular. Over time periods stopped.    Surgical History:  History reviewed. No pertinent surgical history.  Medications:  Current Outpatient Medications on File Prior to Visit  Medication Sig   cyclobenzaprine (FLEXERIL) 10 MG tablet Take 1 tablet (10 mg total) by mouth 3 (three) times daily as needed for muscle spasms.   Multiple Vitamin (MULTIVITAMIN) capsule Take 1 capsule by mouth daily.   sertraline (ZOLOFT) 100 MG tablet TAKE 1 TABLET BY MOUTH  DAILY   No current facility-administered medications on file prior to visit.    Allergies:  Allergies  Allergen Reactions   Duloxetine Other (See Comments)    Seizure-like activity    Social History:  Social History   Socioeconomic History   Marital status: Married    Spouse name: Not on file   Number of children: Not on file   Years of education: Not on file   Highest education level: Not on file  Occupational History   Not on file  Tobacco Use  Smoking status: Never   Smokeless tobacco: Never  Vaping Use   Vaping Use: Never used  Substance and Sexual Activity   Alcohol use: No   Drug use: No   Sexual activity: Yes    Birth control/protection: None  Other Topics Concern   Not on file  Social History Narrative   Not on file   Social Determinants of Health   Financial Resource Strain: Not on file  Food Insecurity: Not on file  Transportation Needs: Not on file  Physical Activity: Not on file  Stress: Not on file  Social Connections: Not on file  Intimate Partner Violence: Not on file   Social History   Tobacco Use  Smoking Status Never  Smokeless Tobacco Never   Social History   Substance and Sexual Activity  Alcohol Use No    Family History:  Family History  Problem Relation Age of Onset   Hypertension Mother    Cancer Father    Diabetes Father    Heart disease  Father    Pancreatitis Father    Mental illness Sister    Heart disease Paternal Aunt    Cancer Paternal Aunt        Breast   Diabetes Paternal Uncle    Cancer Maternal Grandmother        Brain Tumor and Colon Cancer   Stroke Maternal Grandmother    Stroke Maternal Grandfather    Irritable bowel syndrome Cousin     Past medical history, surgical history, medications, allergies, family history and social history reviewed with patient today and changes made to appropriate areas of the chart.   Review of Systems  Eyes:  Negative for blurred vision and double vision.  Respiratory:  Negative for shortness of breath.   Cardiovascular:  Negative for chest pain, palpitations and leg swelling.  Neurological:  Negative for dizziness and headaches.  Psychiatric/Behavioral:  Positive for depression. Negative for suicidal ideas. The patient is nervous/anxious.   All other ROS negative except what is listed above and in the HPI.      Objective:    BP 133/85   Pulse 64   Temp 98.1 F (36.7 C)   Ht 5\' 6"  (1.676 m)   Wt 266 lb 12.8 oz (121 kg)   SpO2 98%   BMI 43.06 kg/m   Wt Readings from Last 3 Encounters:  01/21/21 266 lb 12.8 oz (121 kg)  02/27/20 239 lb (108.4 kg)  09/15/19 220 lb (99.8 kg)    Physical Exam Vitals and nursing note reviewed.  Constitutional:      General: She is awake. She is not in acute distress.    Appearance: She is well-developed. She is obese. She is not ill-appearing.  HENT:     Head: Normocephalic and atraumatic.     Right Ear: Hearing, tympanic membrane, ear canal and external ear normal. No drainage.     Left Ear: Hearing, tympanic membrane, ear canal and external ear normal. No drainage.     Nose: Nose normal.     Right Sinus: No maxillary sinus tenderness or frontal sinus tenderness.     Left Sinus: No maxillary sinus tenderness or frontal sinus tenderness.     Mouth/Throat:     Mouth: Mucous membranes are moist.     Pharynx: Oropharynx is clear.  Uvula midline. No pharyngeal swelling, oropharyngeal exudate or posterior oropharyngeal erythema.  Eyes:     General: Lids are normal.        Right eye: No discharge.  Left eye: No discharge.     Extraocular Movements: Extraocular movements intact.     Conjunctiva/sclera: Conjunctivae normal.     Pupils: Pupils are equal, round, and reactive to light.     Visual Fields: Right eye visual fields normal and left eye visual fields normal.  Neck:     Thyroid: No thyromegaly.     Vascular: No carotid bruit.     Trachea: Trachea normal.  Cardiovascular:     Rate and Rhythm: Normal rate and regular rhythm.     Heart sounds: Normal heart sounds. No murmur heard.   No gallop.  Pulmonary:     Effort: Pulmonary effort is normal. No accessory muscle usage or respiratory distress.     Breath sounds: Normal breath sounds.  Chest:  Breasts:    Right: Normal. No axillary adenopathy or supraclavicular adenopathy.     Left: Normal. No axillary adenopathy or supraclavicular adenopathy.  Abdominal:     General: Bowel sounds are normal.     Palpations: Abdomen is soft. There is no hepatomegaly or splenomegaly.     Tenderness: There is no abdominal tenderness.  Musculoskeletal:        General: Normal range of motion.     Cervical back: Normal range of motion and neck supple.     Right lower leg: No edema.     Left lower leg: No edema.  Lymphadenopathy:     Head:     Right side of head: No submental, submandibular, tonsillar, preauricular or posterior auricular adenopathy.     Left side of head: No submental, submandibular, tonsillar, preauricular or posterior auricular adenopathy.     Cervical: No cervical adenopathy.     Upper Body:     Right upper body: No supraclavicular, axillary or pectoral adenopathy.     Left upper body: No supraclavicular, axillary or pectoral adenopathy.  Skin:    General: Skin is warm and dry.     Capillary Refill: Capillary refill takes less than 2 seconds.      Findings: No rash.  Neurological:     Mental Status: She is alert and oriented to person, place, and time.     Cranial Nerves: Cranial nerves are intact.     Gait: Gait is intact.     Deep Tendon Reflexes: Reflexes are normal and symmetric.     Reflex Scores:      Brachioradialis reflexes are 2+ on the right side and 2+ on the left side.      Patellar reflexes are 2+ on the right side and 2+ on the left side. Psychiatric:        Attention and Perception: Attention normal.        Mood and Affect: Mood normal.        Speech: Speech normal.        Behavior: Behavior normal. Behavior is cooperative.        Thought Content: Thought content normal.        Judgment: Judgment normal.    Results for orders placed or performed during the hospital encounter of 09/15/19  Lipase, blood  Result Value Ref Range   Lipase 29 11 - 51 U/L  Comprehensive metabolic panel  Result Value Ref Range   Sodium 137 135 - 145 mmol/L   Potassium 3.7 3.5 - 5.1 mmol/L   Chloride 102 98 - 111 mmol/L   CO2 28 22 - 32 mmol/L   Glucose, Bld 104 (H) 70 - 99 mg/dL   BUN 13 6 -  20 mg/dL   Creatinine, Ser 0.59 0.44 - 1.00 mg/dL   Calcium 10.3 8.9 - 10.3 mg/dL   Total Protein 7.7 6.5 - 8.1 g/dL   Albumin 4.6 3.5 - 5.0 g/dL   AST 21 15 - 41 U/L   ALT 18 0 - 44 U/L   Alkaline Phosphatase 46 38 - 126 U/L   Total Bilirubin 0.6 0.3 - 1.2 mg/dL   GFR calc non Af Amer >60 >60 mL/min   GFR calc Af Amer >60 >60 mL/min   Anion gap 7 5 - 15  CBC  Result Value Ref Range   WBC 7.8 4.0 - 10.5 K/uL   RBC 4.59 3.87 - 5.11 MIL/uL   Hemoglobin 14.3 12.0 - 15.0 g/dL   HCT 41.5 36.0 - 46.0 %   MCV 90.4 80.0 - 100.0 fL   MCH 31.2 26.0 - 34.0 pg   MCHC 34.5 30.0 - 36.0 g/dL   RDW 11.7 11.5 - 15.5 %   Platelets 326 150 - 400 K/uL   nRBC 0.0 0.0 - 0.2 %  Urinalysis, Complete w Microscopic  Result Value Ref Range   Color, Urine YELLOW YELLOW   APPearance CLEAR CLEAR   Specific Gravity, Urine 1.025 1.005 - 1.030   pH 6.0 5.0  - 8.0   Glucose, UA NEGATIVE NEGATIVE mg/dL   Hgb urine dipstick TRACE (A) NEGATIVE   Bilirubin Urine NEGATIVE NEGATIVE   Ketones, ur NEGATIVE NEGATIVE mg/dL   Protein, ur NEGATIVE NEGATIVE mg/dL   Nitrite NEGATIVE NEGATIVE   Leukocytes,Ua TRACE (A) NEGATIVE   Squamous Epithelial / LPF 0-5 0 - 5   WBC, UA 6-10 0 - 5 WBC/hpf   RBC / HPF 0-5 0 - 5 RBC/hpf   Bacteria, UA NONE SEEN NONE SEEN  Pregnancy, urine  Result Value Ref Range   Preg Test, Ur NEGATIVE NEGATIVE      Assessment & Plan:   Problem List Items Addressed This Visit       Other   Depression, recurrent (HCC)    Chronic.  Controlled.  Continue with current medication regimen.  Labs ordered today.  Return to clinic in 6 months for reevaluation.  Call sooner if concerns arise.         Anxiety    Chronic.  Controlled.  Continue with current medication regimen.  Labs ordered today.  Return to clinic in 6 months for reevaluation.  Call sooner if concerns arise.         Other Visit Diagnoses     Annual physical exam    -  Primary   Health maintenance reviewed during visit today. Labs ordered. Pap is done at GYN. Up to date on vaccines.   Relevant Orders   CBC with Differential/Platelet   Comprehensive metabolic panel   Lipid panel   TSH   Urinalysis, Routine w reflex microscopic   Encounter for hepatitis C screening test for low risk patient       Relevant Orders   Hepatitis C Antibody   Screening for HIV (human immunodeficiency virus)       Relevant Orders   HIV Antibody (routine testing w rflx)        Follow up plan: Return in about 6 months (around 07/24/2021) for Depression/Anxiety FU.   LABORATORY TESTING:  - Pap smear: done elsewhere  IMMUNIZATIONS:   - Tdap: Tetanus vaccination status reviewed: last tetanus booster within 10 years. - Influenza: Postponed to flu season - Pneumovax: Not applicable - Prevnar: Not applicable -  HPV: Up to date - Zostavax vaccine: Not  applicable  SCREENING: -Mammogram: Not applicable  - Colonoscopy: Not applicable  - Bone Density: Not applicable  -Hearing Test: Not applicable  -Spirometry: Not applicable   PATIENT COUNSELING:   Advised to take 1 mg of folate supplement per day if capable of pregnancy.   Sexuality: Discussed sexually transmitted diseases, partner selection, use of condoms, avoidance of unintended pregnancy  and contraceptive alternatives.   Advised to avoid cigarette smoking.  I discussed with the patient that most people either abstain from alcohol or drink within safe limits (<=14/week and <=4 drinks/occasion for males, <=7/weeks and <= 3 drinks/occasion for females) and that the risk for alcohol disorders and other health effects rises proportionally with the number of drinks per week and how often a drinker exceeds daily limits.  Discussed cessation/primary prevention of drug use and availability of treatment for abuse.   Diet: Encouraged to adjust caloric intake to maintain  or achieve ideal body weight, to reduce intake of dietary saturated fat and total fat, to limit sodium intake by avoiding high sodium foods and not adding table salt, and to maintain adequate dietary potassium and calcium preferably from fresh fruits, vegetables, and low-fat dairy products.    stressed the importance of regular exercise  Injury prevention: Discussed safety belts, safety helmets, smoke detector, smoking near bedding or upholstery.   Dental health: Discussed importance of regular tooth brushing, flossing, and dental visits.    NEXT PREVENTATIVE PHYSICAL DUE IN 1 YEAR. Return in about 6 months (around 07/24/2021) for Depression/Anxiety FU.

## 2021-01-22 LAB — COMPREHENSIVE METABOLIC PANEL WITH GFR
ALT: 20 [IU]/L (ref 0–32)
AST: 22 [IU]/L (ref 0–40)
Albumin/Globulin Ratio: 2 (ref 1.2–2.2)
Albumin: 4.6 g/dL (ref 3.8–4.8)
Alkaline Phosphatase: 52 [IU]/L (ref 44–121)
BUN/Creatinine Ratio: 18 (ref 9–23)
BUN: 13 mg/dL (ref 6–20)
Bilirubin Total: 0.4 mg/dL (ref 0.0–1.2)
CO2: 20 mmol/L (ref 20–29)
Calcium: 9.4 mg/dL (ref 8.7–10.2)
Chloride: 102 mmol/L (ref 96–106)
Creatinine, Ser: 0.72 mg/dL (ref 0.57–1.00)
Globulin, Total: 2.3 g/dL (ref 1.5–4.5)
Glucose: 93 mg/dL (ref 65–99)
Potassium: 4.1 mmol/L (ref 3.5–5.2)
Sodium: 138 mmol/L (ref 134–144)
Total Protein: 6.9 g/dL (ref 6.0–8.5)
eGFR: 112 mL/min/{1.73_m2}

## 2021-01-22 LAB — CBC WITH DIFFERENTIAL/PLATELET
Basophils Absolute: 0.1 10*3/uL (ref 0.0–0.2)
Basos: 1 %
EOS (ABSOLUTE): 0.2 10*3/uL (ref 0.0–0.4)
Eos: 3 %
Hematocrit: 43 % (ref 34.0–46.6)
Hemoglobin: 14.4 g/dL (ref 11.1–15.9)
Immature Grans (Abs): 0 10*3/uL (ref 0.0–0.1)
Immature Granulocytes: 1 %
Lymphocytes Absolute: 2.4 10*3/uL (ref 0.7–3.1)
Lymphs: 40 %
MCH: 30.8 pg (ref 26.6–33.0)
MCHC: 33.5 g/dL (ref 31.5–35.7)
MCV: 92 fL (ref 79–97)
Monocytes Absolute: 0.5 10*3/uL (ref 0.1–0.9)
Monocytes: 8 %
Neutrophils Absolute: 2.9 10*3/uL (ref 1.4–7.0)
Neutrophils: 47 %
Platelets: 280 10*3/uL (ref 150–450)
RBC: 4.68 x10E6/uL (ref 3.77–5.28)
RDW: 11.9 % (ref 11.7–15.4)
WBC: 6 10*3/uL (ref 3.4–10.8)

## 2021-01-22 LAB — LIPID PANEL
Chol/HDL Ratio: 4.2 ratio (ref 0.0–4.4)
Cholesterol, Total: 162 mg/dL (ref 100–199)
HDL: 39 mg/dL — ABNORMAL LOW (ref >39)
LDL Chol Calc (NIH): 103 mg/dL — ABNORMAL HIGH (ref 0–99)
Triglycerides: 107 mg/dL (ref 0–149)
VLDL Cholesterol Cal: 20 mg/dL (ref 5–40)

## 2021-01-22 LAB — HIV ANTIBODY (ROUTINE TESTING W REFLEX): HIV Screen 4th Generation wRfx: NONREACTIVE

## 2021-01-22 LAB — HEPATITIS C ANTIBODY: Hep C Virus Ab: 0.1 s/co ratio (ref 0.0–0.9)

## 2021-01-22 LAB — TSH: TSH: 1.79 u[IU]/mL (ref 0.450–4.500)

## 2021-01-24 NOTE — Progress Notes (Signed)
Hi Arva.  Your lab work looks good.  Please let me know if you have any questions.  Follow up as discussed.

## 2021-01-25 ENCOUNTER — Other Ambulatory Visit: Payer: Self-pay | Admitting: Internal Medicine

## 2021-01-25 NOTE — Telephone Encounter (Signed)
Requested Prescriptions  Pending Prescriptions Disp Refills  . sertraline (ZOLOFT) 100 MG tablet [Pharmacy Med Name: Sertraline HCl 100 MG Oral Tablet] 90 tablet 2    Sig: TAKE 1 TABLET BY MOUTH  DAILY     Psychiatry:  Antidepressants - SSRI Passed - 01/25/2021 11:39 PM      Passed - Completed PHQ-2 or PHQ-9 in the last 360 days      Passed - Valid encounter within last 6 months    Recent Outpatient Visits          4 days ago Annual physical exam   [place], NP   11 months ago Anxiety   [facility] [place], [place]   1 year ago Depression, recurrent [facility])   [facility], [place]   1 year ago Annual physical exam   [facility], [place]   1 year ago Joint instability   [place], [place], [place]

## 2021-07-08 ENCOUNTER — Encounter: Payer: Self-pay | Admitting: Nurse Practitioner

## 2021-07-08 ENCOUNTER — Ambulatory Visit: Payer: Managed Care, Other (non HMO) | Admitting: Nurse Practitioner

## 2021-07-08 ENCOUNTER — Other Ambulatory Visit: Payer: Self-pay

## 2021-07-08 VITALS — BP 150/102 | HR 96 | Temp 97.9°F | Ht 66.0 in | Wt 268.0 lb

## 2021-07-08 DIAGNOSIS — R202 Paresthesia of skin: Secondary | ICD-10-CM

## 2021-07-08 DIAGNOSIS — R440 Auditory hallucinations: Secondary | ICD-10-CM

## 2021-07-08 DIAGNOSIS — R200 Anesthesia of skin: Secondary | ICD-10-CM

## 2021-07-08 DIAGNOSIS — R442 Other hallucinations: Secondary | ICD-10-CM | POA: Diagnosis not present

## 2021-07-08 DIAGNOSIS — R519 Headache, unspecified: Secondary | ICD-10-CM

## 2021-07-08 DIAGNOSIS — G8929 Other chronic pain: Secondary | ICD-10-CM

## 2021-07-08 NOTE — Progress Notes (Signed)
BP (!) 150/102 (BP Location: Left Arm, Patient Position: Sitting, Cuff Size: Large)    Pulse 96    Temp 97.9 F (36.6 C) (Oral)    Ht _0  (1.676 m)    Wt 268 lb (121.6 kg)    SpO2 96%    BMI 43.26 kg/m    Subjective:    Patient ID: Ruth Bass, female    DOB: [birth year], 35 y.o.   MRN: [id number]  HPI: Ruth Bass is a 35 y.o. female  Chief Complaint  Patient presents with   Headache    Right-sided localized frontal intermittent; also having olfactory disturbances of smelling smoke and metallic taste in mouth; history of multiple falls and has hit head when fallen before   Leg Pain    X1 week; right; weakness and numbness associated; mid-back pain and right arm numbness also   HEADACHE Right-sided localized frontal intermittent; also having olfactory disturbances of smelling smoke and metallic taste in mouth; history of multiple falls several times over the year. She has been getting light headed, nausea and had trouble thinking for periods of times throughout the year.  She has had these symptoms for a couple of years but they are getting worse recently. During these episodes she is also having auditory hallucinations.  States 3 years ago she had an MRI done due to hearing musical sounds constantly.   LEG PAIN X1 week; right; weakness and numbness associated with the head pain.  Patient states her legs haven't given out on her but she has had to catch herself a couple of times from falling. Has some spinal pain and "hypermobility" of her neck.    Relevant past medical, surgical, family and social history reviewed and updated as indicated. Interim medical history since our last visit reviewed. Allergies and medications reviewed and updated.  Review of Systems  HENT:         Auditory hallucinations and smelling smoke and metallic taste.  Gastrointestinal:  Positive for nausea.  Musculoskeletal:        Leg pain  Neurological:  Positive for weakness, light-headedness and  headaches.   Per HPI unless specifically indicated above     Objective:    BP (!) 150/102 (BP Location: Left Arm, Patient Position: Sitting, Cuff Size: Large)    Pulse 96    Temp 97.9 F (36.6 C) (Oral)    Ht _1  (1.676 m)    Wt 268 lb (121.6 kg)    SpO2 96%    BMI 43.26 kg/m   Wt Readings from Last 3 Encounters:  07/08/21 268 lb (121.6 kg)  01/21/21 266 lb 12.8 oz (121 kg)  02/27/20 239 lb (108.4 kg)    Physical Exam Vitals and nursing note reviewed.  Constitutional:      General: She is not in acute distress.    Appearance: Normal appearance. She is normal weight. She is not ill-appearing, toxic-appearing or diaphoretic.  HENT:     Head: Normocephalic.     Right Ear: External ear normal.     Left Ear: External ear normal.     Nose: Nose normal.     Mouth/Throat:     Mouth: Mucous membranes are moist.     Pharynx: Oropharynx is clear.  Eyes:     General:        Right eye: No discharge.        Left eye: No discharge.     Extraocular Movements: Extraocular movements intact.     Right eye:  Normal extraocular motion and no nystagmus.     Left eye: Normal extraocular motion and no nystagmus.     Conjunctiva/sclera: Conjunctivae normal.     Pupils: Pupils are equal, round, and reactive to light.  Cardiovascular:     Rate and Rhythm: Normal rate and regular rhythm.     Heart sounds: No murmur heard. Pulmonary:     Effort: Pulmonary effort is normal. No respiratory distress.     Breath sounds: Normal breath sounds. No wheezing or rales.  Musculoskeletal:     Cervical back: Normal range of motion and neck supple.  Skin:    General: Skin is warm and dry.     Capillary Refill: Capillary refill takes less than 2 seconds.  Neurological:     General: No focal deficit present.     Mental Status: She is alert and oriented to person, place, and time. Mental status is at baseline.     Cranial Nerves: Cranial nerves 2-12 are intact.     Sensory: Sensation is intact.     Motor:  Motor function is intact.     Coordination: Coordination is intact.     Gait: Gait is intact.  Psychiatric:        Mood and Affect: Mood normal.        Behavior: Behavior normal.        Thought Content: Thought content normal.        Judgment: Judgment normal.    Results for orders placed or performed in visit on 01/21/21  Microscopic Examination   Urine  Result Value Ref Range   WBC, UA 0-5 0 - 5 /hpf   RBC None seen 0 - 2 /hpf   Epithelial Cells (non renal) 0-10 0 - 10 /hpf   Bacteria, UA None seen None seen/Few  CBC with Differential/Platelet  Result Value Ref Range   WBC 6.0 3.4 - 10.8 x10E3/uL   RBC 4.68 3.77 - 5.28 x10E6/uL   Hemoglobin 14.4 11.1 - 15.9 g/dL   Hematocrit 43.0 34.0 - 46.6 %   MCV 92 79 - 97 fL   MCH 30.8 26.6 - 33.0 pg   MCHC 33.5 31.5 - 35.7 g/dL   RDW 11.9 11.7 - 15.4 %   Platelets 280 150 - 450 x10E3/uL   Neutrophils 47 Not Estab. %   Lymphs 40 Not Estab. %   Monocytes 8 Not Estab. %   Eos 3 Not Estab. %   Basos 1 Not Estab. %   Neutrophils Absolute 2.9 1.4 - 7.0 x10E3/uL   Lymphocytes Absolute 2.4 0.7 - 3.1 x10E3/uL   Monocytes Absolute 0.5 0.1 - 0.9 x10E3/uL   EOS (ABSOLUTE) 0.2 0.0 - 0.4 x10E3/uL   Basophils Absolute 0.1 0.0 - 0.2 x10E3/uL   Immature Granulocytes 1 Not Estab. %   Immature Grans (Abs) 0.0 0.0 - 0.1 x10E3/uL  Comprehensive metabolic panel  Result Value Ref Range   Glucose 93 65 - 99 mg/dL   BUN 13 6 - 20 mg/dL   Creatinine, Ser 0.72 0.57 - 1.00 mg/dL   eGFR 112 >59 mL/min/1.73   BUN/Creatinine Ratio 18 9 - 23   Sodium 138 134 - 144 mmol/L   Potassium 4.1 3.5 - 5.2 mmol/L   Chloride 102 96 - 106 mmol/L   CO2 20 20 - 29 mmol/L   Calcium 9.4 8.7 - 10.2 mg/dL   Total Protein 6.9 6.0 - 8.5 g/dL   Albumin 4.6 3.8 - 4.8 g/dL   Globulin, Total 2.3 1.5 -  4.5 g/dL   Albumin/Globulin Ratio 2.0 1.2 - 2.2   Bilirubin Total 0.4 0.0 - 1.2 mg/dL   Alkaline Phosphatase 52 44 - 121 IU/L   AST 22 0 - 40 IU/L   ALT 20 0 - 32 IU/L   Lipid panel  Result Value Ref Range   Cholesterol, Total 162 100 - 199 mg/dL   Triglycerides 107 0 - 149 mg/dL   HDL 39 (L) >39 mg/dL   VLDL Cholesterol Cal 20 5 - 40 mg/dL   LDL Chol Calc (NIH) 103 (H) 0 - 99 mg/dL   Chol/HDL Ratio 4.2 0.0 - 4.4 ratio  TSH  Result Value Ref Range   TSH 1.790 0.450 - 4.500 uIU/mL  Urinalysis, Routine w reflex microscopic  Result Value Ref Range   Specific Gravity, UA 1.010 1.005 - 1.030   pH, UA 6.0 5.0 - 7.5   Color, UA Yellow Yellow   Appearance Ur Clear Clear   Leukocytes,UA Trace (A) Negative   Protein,UA Negative Negative/Trace   Glucose, UA Negative Negative   Ketones, UA Negative Negative   RBC, UA Negative Negative   Bilirubin, UA Negative Negative   Urobilinogen, Ur 0.2 0.2 - 1.0 mg/dL   Nitrite, UA Negative Negative   Microscopic Examination See below:   HIV Antibody (routine testing w rflx)  Result Value Ref Range   HIV Screen 4th Generation wRfx Non Reactive Non Reactive  Hepatitis C Antibody  Result Value Ref Range   Hep C Virus Ab 0.1 0.0 - 0.9 s/co ratio      Assessment & Plan:   Problem List Items Addressed This Visit       Other   Chronic nonintractable headache - Primary    Symptoms have been ongoing for a few years.  Have worsened recently. Previous imaging reviewed and unable to determine cause of symptoms. Will order updated MRI to evaluate Brain. Will also sent patient to Neurology for further evaluation and treatment. Labs ordered today. Will make further recommendations based on imaging and lab results.      Relevant Orders   RPR   QuantiFERON-TB Gold Plus   B12   Comp Met (CMET)   CBC w/Diff   MR MRV HEAD W WO CONTRAST   Ambulatory referral to Neurology   HIV antibody (with reflex)   Other Visit Diagnoses     Auditory hallucination       See headache plan of care.   Relevant Orders   RPR   QuantiFERON-TB Gold Plus   B12   Comp Met (CMET)   CBC w/Diff   MR MRV HEAD W WO CONTRAST   Ambulatory  referral to Neurology   HIV antibody (with reflex)   Olfactory hallucination       See headache plan of care.   Relevant Orders   RPR   QuantiFERON-TB Gold Plus   B12   Comp Met (CMET)   CBC w/Diff   MR MRV HEAD W WO CONTRAST   Ambulatory referral to Neurology   HIV antibody (with reflex)   Numbness and tingling       See headache plan of care.   Relevant Orders   RPR   QuantiFERON-TB Gold Plus   B12   Comp Met (CMET)   CBC w/Diff   MR MRV HEAD W WO CONTRAST   Ambulatory referral to Neurology   HIV antibody (with reflex)        Follow up plan: Return if symptoms worsen or fail to  improve.

## 2021-07-08 NOTE — Assessment & Plan Note (Signed)
Symptoms have been ongoing for a few years.  Have worsened recently. Previous imaging reviewed and unable to determine cause of symptoms. Will order updated MRI to evaluate Brain. Will also sent patient to Neurology for further evaluation and treatment. Labs ordered today. Will make further recommendations based on imaging and lab results.

## 2021-07-11 LAB — CBC WITH DIFFERENTIAL/PLATELET
Basophils Absolute: 0.1 10*3/uL (ref 0.0–0.2)
Basos: 1 %
EOS (ABSOLUTE): 0.2 10*3/uL (ref 0.0–0.4)
Eos: 2 %
Hematocrit: 43.6 % (ref 34.0–46.6)
Hemoglobin: 14.8 g/dL (ref 11.1–15.9)
Immature Grans (Abs): 0 10*3/uL (ref 0.0–0.1)
Immature Granulocytes: 1 %
Lymphocytes Absolute: 2.2 10*3/uL (ref 0.7–3.1)
Lymphs: 32 %
MCH: 31.3 pg (ref 26.6–33.0)
MCHC: 33.9 g/dL (ref 31.5–35.7)
MCV: 92 fL (ref 79–97)
Monocytes Absolute: 0.6 10*3/uL (ref 0.1–0.9)
Monocytes: 8 %
Neutrophils Absolute: 3.9 10*3/uL (ref 1.4–7.0)
Neutrophils: 56 %
Platelets: 322 10*3/uL (ref 150–450)
RBC: 4.73 x10E6/uL (ref 3.77–5.28)
RDW: 11.8 % (ref 11.7–15.4)
WBC: 6.9 10*3/uL (ref 3.4–10.8)

## 2021-07-11 LAB — COMPREHENSIVE METABOLIC PANEL
ALT: 22 IU/L (ref 0–32)
AST: 19 IU/L (ref 0–40)
Albumin/Globulin Ratio: 1.5 (ref 1.2–2.2)
Albumin: 4.3 g/dL (ref 3.8–4.8)
Alkaline Phosphatase: 62 IU/L (ref 44–121)
BUN/Creatinine Ratio: 16 (ref 9–23)
BUN: 11 mg/dL (ref 6–20)
Bilirubin Total: 0.2 mg/dL (ref 0.0–1.2)
CO2: 24 mmol/L (ref 20–29)
Calcium: 9.4 mg/dL (ref 8.7–10.2)
Chloride: 105 mmol/L (ref 96–106)
Creatinine, Ser: 0.67 mg/dL (ref 0.57–1.00)
Globulin, Total: 2.8 g/dL (ref 1.5–4.5)
Glucose: 104 mg/dL — ABNORMAL HIGH (ref 70–99)
Potassium: 3.9 mmol/L (ref 3.5–5.2)
Sodium: 142 mmol/L (ref 134–144)
Total Protein: 7.1 g/dL (ref 6.0–8.5)
eGFR: 117 mL/min/{1.73_m2} (ref >59)

## 2021-07-11 LAB — QUANTIFERON-TB GOLD PLUS
QuantiFERON Mitogen Value: >10 IU/mL
QuantiFERON Nil Value: 0 IU/mL
QuantiFERON TB1 Ag Value: 0 IU/mL
QuantiFERON TB2 Ag Value: 0 IU/mL
QuantiFERON-TB Gold Plus: NEGATIVE

## 2021-07-11 LAB — HIV ANTIBODY (ROUTINE TESTING W REFLEX): HIV Screen 4th Generation wRfx: NONREACTIVE

## 2021-07-11 LAB — VITAMIN B12: Vitamin B-12: 229 pg/mL — ABNORMAL LOW (ref 232–1245)

## 2021-07-11 LAB — RPR: RPR Ser Ql: NONREACTIVE

## 2021-07-12 NOTE — Telephone Encounter (Signed)
This encounter was created in error - please disregard.

## 2021-07-12 NOTE — Progress Notes (Signed)
Please let patient know that her lab work is relatively unremarkable besides her B12 is low. I recommend she start an over the counter b12 supplement to help with this.  We will recheck this in the future. Please remind her to schedule the brain MRI.  Please let me know if she has any questions.

## 2021-08-26 ENCOUNTER — Encounter: Payer: Self-pay | Admitting: Diagnostic Neuroimaging

## 2021-08-26 ENCOUNTER — Ambulatory Visit: Payer: Managed Care, Other (non HMO) | Admitting: Diagnostic Neuroimaging

## 2021-08-26 ENCOUNTER — Other Ambulatory Visit: Payer: Self-pay

## 2021-08-26 VITALS — BP 139/86 | HR 75 | Ht 66.0 in | Wt 268.4 lb

## 2021-08-26 DIAGNOSIS — R4689 Other symptoms and signs involving appearance and behavior: Secondary | ICD-10-CM

## 2021-08-26 MED ORDER — TOPIRAMATE 50 MG PO TABS: 50.0000 mg | ORAL_TABLET | Freq: Two times a day (BID) | ORAL | 6 refills | Status: DC

## 2021-08-26 NOTE — Progress Notes (Signed)
[facility]  PATIENT: Ruth Bass DOB: [birth year]  REFERRING CLINICIAN: Jon Billings, NP HISTORY FROM: patient  REASON FOR VISIT: new consult   HISTORICAL  CHIEF COMPLAINT:  Chief Complaint  Patient presents with   New Patient (Initial Visit)    Room 6 - alone. She having daily headaches, often associated with olfactory and auditory hallucinations (smells smoke or hears music). Reports history of seizure-like activity.     HISTORY OF PRESENT ILLNESS:   36 year old female here for evaluation of abnormal spells.  Around 2019 patient had onset of right frontal pain, shocklike sensations lasting 30 seconds at a time.  Oftentimes these are associated with perceived smell of smoke, metallic taste in the mouth, hearing classical music.  Sensory hallucinations can last several minutes for several hours.  Sometimes patient had confusion after these episodes.  Patient was evaluated for seizures in the past with MRI and EEG which have been unremarkable.  Patient continues to have his episodes several times a week.  Some episodes have been associated with right leg pain, spasms and weakness.  Patient has had neurological shocklike sensations down her spine.  Patient also was having some intermittent headaches.  40 pain, throbbing sensation, dizziness, nausea, sensitive to light and sound, tingling sensation on the back of her neck and scalp.  These happen 2-3 times a week.  Patient has tried nortriptyline, gabapentin, Maxalt in the past without relief.    Patient has family history of migraine in brother and sister.  No family history of seizure.  Patient has had some issues with depression in the past under good control with medication currently.  Has had some evaluation for fibromyalgia in the past as well.    REVIEW OF SYSTEMS: Full 14 system review of systems performed and negative with exception of: as per HPI.  ALLERGIES: Allergies  Allergen Reactions    Duloxetine Other (See Comments)    Seizure-like activity    HOME MEDICATIONS: Outpatient Medications Prior to Visit  Medication Sig Dispense Refill   cyclobenzaprine (FLEXERIL) 10 MG tablet Take 1 tablet (10 mg total) by mouth 3 (three) times daily as needed for muscle spasms. 90 tablet 0   meclizine (ANTIVERT) 25 MG tablet Take 1 tablet (25 mg total) by mouth 3 (three) times daily as needed for dizziness. 90 tablet 1   Multiple Vitamin (MULTIVITAMIN) capsule Take 1 capsule by mouth daily.     sertraline (ZOLOFT) 100 MG tablet TAKE 1 TABLET BY MOUTH  DAILY 90 tablet 2   No facility-administered medications prior to visit.    PAST MEDICAL HISTORY: Past Medical History:  Diagnosis Date   Amenorrhea    Since child, periods severely irregular. Over time periods stopped.   Anxiety    Depression    Migraine    PCOS (polycystic ovarian syndrome)    Seizures ([place])     PAST SURGICAL HISTORY: History reviewed. No pertinent surgical history.  FAMILY HISTORY: Family History  Problem Relation Age of Onset   Hypertension Mother    Cancer Father    Diabetes Father    Heart disease Father    Pancreatitis Father    Mental illness Sister    Heart disease Paternal Aunt    Cancer Paternal Aunt        Breast   Diabetes Paternal Uncle    Cancer Maternal Grandmother        Brain Tumor and Colon Cancer   Stroke Maternal Grandmother    Stroke Maternal Grandfather  Irritable bowel syndrome Cousin     SOCIAL HISTORY: Social History   Socioeconomic History   Marital status: Married    Spouse name: Kathie Rhodes   Number of children: 0   Years of education: some college   Highest education level: Not on file  Occupational History   Occupation: homemaker  Tobacco Use   Smoking status: Never   Smokeless tobacco: Never  Vaping Use   Vaping Use: Never used  Substance and Sexual Activity   Alcohol use: Not Currently   Drug use: Never   Sexual activity: Yes    Partners: Male     Birth control/protection: None  Other Topics Concern   Not on file  Social History Narrative   Lives at home with husband.   Right-handed.   Caffeine use: 2 cups per day.    Social Determinants of Health   Financial Resource Strain: Not on file  Food Insecurity: Not on file  Transportation Needs: Not on file  Physical Activity: Not on file  Stress: Not on file  Social Connections: Not on file  Intimate Partner Violence: Not on file     PHYSICAL EXAM  GENERAL EXAM/CONSTITUTIONAL: Vitals:  Vitals:   08/26/21 0801  BP: 139/86  Pulse: 75  Weight: 268 lb 6.4 oz (121.7 kg)  Height: 5\' 6"  (1.676 m)   Body mass index is 43.32 kg/m. Wt Readings from Last 3 Encounters:  08/26/21 268 lb 6.4 oz (121.7 kg)  07/08/21 268 lb (121.6 kg)  01/21/21 266 lb 12.8 oz (121 kg)   Patient is in no distress; well developed, nourished and groomed; neck is supple  CARDIOVASCULAR: Examination of carotid arteries is normal; no carotid bruits Regular rate and rhythm, no murmurs Examination of peripheral vascular system by observation and palpation is normal  EYES: Ophthalmoscopic exam of optic discs and posterior segments is normal; no papilledema or hemorrhages No results found.  MUSCULOSKELETAL: Gait, strength, tone, movements noted in Neurologic exam below  NEUROLOGIC: MENTAL STATUS:  No flowsheet data found. awake, alert, oriented to person, place and time recent and remote memory intact normal attention and concentration language fluent, comprehension intact, naming intact fund of knowledge appropriate  CRANIAL NERVE:  2nd - no papilledema on fundoscopic exam 2nd, 3rd, 4th, 6th - pupils equal and reactive to light, visual fields full to confrontation, extraocular muscles intact, no nystagmus 5th - facial sensation symmetric 7th - facial strength symmetric 8th - hearing intact 9th - palate elevates symmetrically, uvula midline 11th - shoulder shrug symmetric 12th -  tongue protrusion midline  MOTOR:  normal bulk and tone, full strength in the BUE, BLE  SENSORY:  normal and symmetric to light touch, temperature, vibration  COORDINATION:  finger-nose-finger, fine finger movements normal  REFLEXES:  deep tendon reflexes present and symmetric  GAIT/STATION:  narrow based gait     DIAGNOSTIC DATA (LABS, IMAGING, TESTING) - I reviewed patient records, labs, notes, testing and imaging myself where available.  Lab Results  Component Value Date   WBC 6.9 07/08/2021   HGB 14.8 07/08/2021   HCT 43.6 07/08/2021   MCV 92 07/08/2021   PLT 322 07/08/2021      Component Value Date/Time   NA 142 07/08/2021 1553   K 3.9 07/08/2021 1553   CL 105 07/08/2021 1553   CO2 24 07/08/2021 1553   GLUCOSE 104 (H) 07/08/2021 1553   GLUCOSE 104 (H) 09/15/2019 1911   BUN 11 07/08/2021 1553   CREATININE 0.67 07/08/2021 1553   CALCIUM  9.4 07/08/2021 1553   PROT 7.1 07/08/2021 1553   ALBUMIN 4.3 07/08/2021 1553   AST 19 07/08/2021 1553   ALT 22 07/08/2021 1553   ALKPHOS 62 07/08/2021 1553   BILITOT 0.2 07/08/2021 1553   GFRNONAA >60 09/15/2019 1911   GFRAA >60 09/15/2019 1911   Lab Results  Component Value Date   CHOL 162 01/21/2021   HDL 39 (L) 01/21/2021   LDLCALC 103 (H) 01/21/2021   TRIG 107 01/21/2021   CHOLHDL 4.2 01/21/2021   No results found for: HGBA1C Lab Results  Component Value Date   VITAMINB12 229 (L) 07/08/2021   Lab Results  Component Value Date   TSH 1.790 01/21/2021    01/19/2018  ROUTINE EEG IMPRESSION: This routine EEG in the awake and asleep states is within normal limits.   01/25/2018  72 HOUR EEG IMPRESSION: This 72 hour ambulatory EEG with video in the awake and asleep states is within normal limits.  07/06/18 MRI cervical  1. Mild cervical spondylosis without spinal stenosis. 2. Mild-to-moderate right neural foraminal stenosis at C3-4 and mild right neural foraminal stenosis at C4-5.  08/27/19 MRI brain  (without)  1. Unchanged MRI appearance of the brain as compared to 08/07/2017. 2. No evidence of acute intracranial abnormality. 3. A few tiny scattered foci of T2 hyperintensity within the cerebral white matter are stable and consistent with sequela of a nonspecific remote insult. 4. Punctate focus of mineralization versus chronic microhemorrhage within the left frontal lobe.    ASSESSMENT AND PLAN  36 y.o. year old female here with:   Dx:  1. Spell of abnormal behavior       PLAN:  ABNORMAL SPELLS (right frontal HA, olfactory and auditory aura, confusion; 30-60 seconds; multiple per day; few times per week) - suspicious for complex partial / temporal lobe seizures vs complicated migraine - check MRI brain w/wo, EEG  MIGRAINE WITH AURA - trial of topiramate 50mg  at bedtime; after 1-2 weeks increase to 50mg   twice a day; drink plenty of water - ibuprofen, tylenol as needed for breakthrough headaches  Orders Placed This Encounter  Procedures   MR BRAIN W WO CONTRAST   EEG adult    Meds ordered this encounter  Medications   topiramate (TOPAMAX) 50 MG tablet    Sig: Take 1 tablet (50 mg total) by mouth 2 (two) times daily.    Dispense:  60 tablet    Refill:  6   Return in about 6 months (around 02/23/2022) for MyChart visit (15 min).  I reviewed images, labs, notes, records myself. I summarized findings and reviewed with patient, for this high risk condition (possible seizure disorder) requiring high complexity decision making.    Penni Bombard, MD 1/60/1093, 2:35 AM Certified in Neurology, Neurophysiology and Neuroimaging  [facility] [address] [place], [place] [address] [phone]

## 2021-08-26 NOTE — Patient Instructions (Signed)
ABNORMAL SPELLS (right frontal HA, olfactory and auditory aura, confusion; 30-60 seconds; multiple per day; few times per week) - suspicious for complex partial / temporal lobe seizures vs complicated migraine - check MRI brain w/wo, EEG - trial of topiramate 50mg  at bedtime; after 1-2 weeks increase to 50mg   twice a day; drink plenty of water - ibuprofen, tylenol as needed for breakthrough headaches

## 2021-09-01 ENCOUNTER — Telehealth: Payer: Self-pay | Admitting: Diagnostic Neuroimaging

## 2021-09-01 NOTE — Telephone Encounter (Signed)
MR Brain w/wo contrast Dr. Ronnell [place]: [id number] (exp. 08/31/21 to 02/27/22). Patient is scheduled at [facility] for 09/14/21.

## 2021-09-06 ENCOUNTER — Other Ambulatory Visit: Payer: Self-pay | Admitting: *Deleted

## 2021-09-13 ENCOUNTER — Ambulatory Visit: Payer: Managed Care, Other (non HMO) | Admitting: Diagnostic Neuroimaging

## 2021-09-13 DIAGNOSIS — R4182 Altered mental status, unspecified: Secondary | ICD-10-CM | POA: Diagnosis not present

## 2021-09-13 DIAGNOSIS — R4689 Other symptoms and signs involving appearance and behavior: Secondary | ICD-10-CM

## 2021-09-14 ENCOUNTER — Ambulatory Visit (INDEPENDENT_AMBULATORY_CARE_PROVIDER_SITE_OTHER): Payer: Managed Care, Other (non HMO)

## 2021-09-14 DIAGNOSIS — R202 Paresthesia of skin: Secondary | ICD-10-CM

## 2021-09-14 DIAGNOSIS — G8929 Other chronic pain: Secondary | ICD-10-CM

## 2021-09-14 DIAGNOSIS — R442 Other hallucinations: Secondary | ICD-10-CM

## 2021-09-14 DIAGNOSIS — R200 Anesthesia of skin: Secondary | ICD-10-CM

## 2021-09-14 DIAGNOSIS — R519 Headache, unspecified: Secondary | ICD-10-CM

## 2021-09-14 DIAGNOSIS — R440 Auditory hallucinations: Secondary | ICD-10-CM

## 2021-09-14 DIAGNOSIS — R4689 Other symptoms and signs involving appearance and behavior: Secondary | ICD-10-CM

## 2021-09-14 MED ORDER — GADOBENATE DIMEGLUMINE 529 MG/ML IV SOLN
20.0000 mL | Freq: Once | INTRAVENOUS | Status: AC | PRN
  Administered 2021-09-14: 20 mL via INTRAVENOUS

## 2021-10-04 ENCOUNTER — Other Ambulatory Visit: Payer: Self-pay | Admitting: Nurse Practitioner

## 2021-10-06 NOTE — Telephone Encounter (Signed)
Spoke with pt and she states she will schedule through MyChart ?

## 2021-10-06 NOTE — Telephone Encounter (Signed)
Patient is due for follow up.

## 2021-10-06 NOTE — Telephone Encounter (Signed)
Requested medications are due for refill today.  yes ? ?Requested medications are on the active medications list.  yes ? ?Last refill. 01/25/2021 #90 2 refills ? ?Future visit scheduled.   no ? ?Notes to clinic.  Medication refill is not delegated. ? ? ? ?Requested Prescriptions  ?Pending Prescriptions Disp Refills  ? sertraline (ZOLOFT) 100 MG tablet [Pharmacy Med Name: Sertraline HCl 100 MG Oral Tablet] 90 tablet 3  ?  Sig: TAKE 1 TABLET BY MOUTH  DAILY  ?  ? Not Delegated - Psychiatry:  Antidepressants - SSRI - sertraline Failed - 10/04/2021 10:14 PM  ?  ?  Failed - This refill cannot be delegated  ?  ?  Passed - AST in normal range and within 360 days  ?  AST  ?Date Value Ref Range Status  ?07/08/2021 19 0 - 40 IU/L Final  ?  ?  ?  ?  Passed - ALT in normal range and within 360 days  ?  ALT  ?Date Value Ref Range Status  ?07/08/2021 22 0 - 32 IU/L Final  ?  ?  ?  ?  Passed - Completed PHQ-2 or PHQ-9 in the last 360 days  ?  ?  Passed - Valid encounter within last 6 months  ?  Recent Outpatient Visits   ? ?      ? 3 months ago Chronic nonintractable headache, unspecified headache type  ? [place], NP  ? 8 months ago Annual physical exam  ? [place], NP  ? 1 year ago Anxiety  ? [place], [place]  ? 2 years ago Depression, recurrent [facility])  ? [place] [facility] [place], [place]  ? 2 years ago Annual physical exam  ? [facility] [place] Roof [place], [place]  ? ?  ?  ?Future Appointments   ? ?        ? In 4 months Penumalli, Earlean Polka, MD [facility]  ? ?  ? ?  ?  ?  ?  ?

## 2021-10-07 NOTE — Procedures (Signed)
? ?  [facility] ? ?EEG (ELECTROENCEPHALOGRAM) REPORT ? ? ?STUDY DATE: 09/13/21 ?PATIENT NAME: Ruth Bass ?DOB: 1986/03/08 ?MRN: [id number] ? ?ORDERING CLINICIAN: Andrey Spearman, MD  ? ?TECHNOLOGIST: Myer Peer ?TECHNIQUE: Electroencephalogram was recorded utilizing standard 10-20 system of lead placement and reformatted into average and bipolar montages.  ?RECORDING TIME: 23 minutes  ?ACTIVATION: hyperventilation and photic stimulation ? ?CLINICAL INFORMATION: 36 year old female with  ? ?FINDINGS: Posterior dominant background rhythms, which attenuate with eye opening, ranging 10-11 hertz and 30-40 microvolts. No focal, lateralizing, epileptiform activity or seizures are seen. Patient recorded in the awake and drowsy state. EKG channel shows regular rhythm of 30-40 beats per minute. ? ? ?IMPRESSION:  ? ?Normal EEG in the awake and drowsy states. ? ? ?INTERPRETING PHYSICIAN:  ?Penni Bombard, MD ?Certified in Neurology, Neurophysiology and Neuroimaging ? ?[facility] ?[address], [address] ?[place], [place] [address] ?[phone] ? ?

## 2021-11-17 ENCOUNTER — Other Ambulatory Visit: Payer: Self-pay | Admitting: Nurse Practitioner

## 2021-11-18 NOTE — Telephone Encounter (Signed)
Pt needs appointment

## 2021-11-18 NOTE — Telephone Encounter (Signed)
Requested medication (s) are due for refill today -yes ? ?Requested medication (s) are on the active medication list -yes ? ?Future visit scheduled -no ? ?Last refill: 10/06/21 #30 ? ?Notes to clinic: patient declined to schedule when contacted by office- courtesy RF given- request sent for review  ? ?Requested Prescriptions  ?Pending Prescriptions Disp Refills  ? sertraline (ZOLOFT) 100 MG tablet [Pharmacy Med Name: Sertraline HCl 100 MG Oral Tablet] 30 tablet 11  ?  Sig: TAKE 1 TABLET BY MOUTH  DAILY  ?  ? Psychiatry:  Antidepressants - SSRI - sertraline Passed - 11/17/2021 11:09 PM  ?  ?  Passed - AST in normal range and within 360 days  ?  AST  ?Date Value Ref Range Status  ?07/08/2021 19 0 - 40 IU/L Final  ?   ?  ?  Passed - ALT in normal range and within 360 days  ?  ALT  ?Date Value Ref Range Status  ?07/08/2021 22 0 - 32 IU/L Final  ?   ?  ?  Passed - Completed PHQ-2 or PHQ-9 in the last 360 days  ?  ?  Passed - Valid encounter within last 6 months  ?  Recent Outpatient Visits   ? ?      ? 4 months ago Chronic nonintractable headache, unspecified headache type  ? [place], NP  ? 10 months ago Annual physical exam  ? [place], NP  ? 1 year ago Anxiety  ? [place], [place]  ? 2 years ago Depression, recurrent [facility])  ? [facility] [place], [place]  ? 2 years ago Annual physical exam  ? [facility] [place] Ro[facility] [place], [place]  ? ?  ?  ?Future Appointments   ? ?        ? In 3 months Penumalli, Earlean Polka, MD [facility]  ? ?  ? ? ?  ?  ?  ? ? ? ?Requested Prescriptions  ?Pending Prescriptions Disp Refills  ? sertraline (ZOLOFT) 100 MG tablet [Pharmacy Med Name: Sertraline HCl 100 MG Oral Tablet] 30 tablet 11  ?  Sig: TAKE 1 TABLET BY MOUTH  DAILY  ?  ? Psychiatry:  Antidepressants - SSRI - sertraline Passed - 11/17/2021 11:09 PM  ?  ?  Passed - AST in normal  range and within 360 days  ?  AST  ?Date Value Ref Range Status  ?07/08/2021 19 0 - 40 IU/L Final  ?   ?  ?  Passed - ALT in normal range and within 360 days  ?  ALT  ?Date Value Ref Range Status  ?07/08/2021 22 0 - 32 IU/L Final  ?   ?  ?  Passed - Completed PHQ-2 or PHQ-9 in the last 360 days  ?  ?  Passed - Valid encounter within last 6 months  ?  Recent Outpatient Visits   ? ?      ? 4 months ago Chronic nonintractable headache, unspecified headache type  ? [place], NP  ? 10 months ago Annual physical exam  ? [place], NP  ? 1 year ago Anxiety  ? [place], [place]  ? 2 years ago Depression, recurrent [facility])  ? [facility] [place], [place]  ? 2 years ago Annual physical exam  ? [place], [place],  PA-C  ? ?  ?  ?Future Appointments   ? ?        ? In 3 months Penumalli, Earlean Polka, MD [facility]  ? ?  ? ? ?  ?  ?  ? ? ? ?

## 2021-11-18 NOTE — Telephone Encounter (Signed)
Patient is overdue for follow up. Please call to schedule.  ?

## 2021-11-30 ENCOUNTER — Ambulatory Visit: Payer: Managed Care, Other (non HMO) | Admitting: Nurse Practitioner

## 2022-01-06 ENCOUNTER — Telehealth: Payer: Managed Care, Other (non HMO) | Admitting: Nurse Practitioner

## 2022-01-06 ENCOUNTER — Encounter: Payer: Self-pay | Admitting: Nurse Practitioner

## 2022-01-06 DIAGNOSIS — F339 Major depressive disorder, recurrent, unspecified: Secondary | ICD-10-CM

## 2022-01-06 DIAGNOSIS — F419 Anxiety disorder, unspecified: Secondary | ICD-10-CM | POA: Diagnosis not present

## 2022-01-06 MED ORDER — SERTRALINE HCL 100 MG PO TABS: 100.0000 mg | ORAL_TABLET | Freq: Every day | ORAL | 0 refills | Status: DC

## 2022-01-06 MED ORDER — SERTRALINE HCL 50 MG PO TABS: 50.0000 mg | ORAL_TABLET | Freq: Every day | ORAL | 0 refills | Status: DC

## 2022-01-06 MED ORDER — CYCLOBENZAPRINE HCL 10 MG PO TABS: 10.0000 mg | ORAL_TABLET | Freq: Three times a day (TID) | ORAL | 0 refills | Status: AC | PRN

## 2022-01-06 MED ORDER — MECLIZINE HCL 25 MG PO TABS
25.0000 mg | ORAL_TABLET | Freq: Three times a day (TID) | ORAL | 1 refills | Status: AC | PRN
Start: 2022-01-06 — End: ?

## 2022-01-06 NOTE — Assessment & Plan Note (Signed)
Chronic. Doing well with Zoloft but would like to increase the dose.  Will increase to Zoloft '150mg'$  daily.  Medication sent to the pharmacy. Will follow up in 1 month for reevaluation.  Call sooner if concerns arise.

## 2022-01-06 NOTE — Progress Notes (Signed)
There were no vitals taken for this visit.   Subjective:    Patient ID: Ruth Bass, female    DOB: June 12, 1986, 36 y.o.   MRN: [id number]  HPI: Ruth Bass is a 36 y.o. female  Chief Complaint  Patient presents with   Depression   Anxiety    6 month follow up- pt states needing refill on zoloft and flexiril    Here today for 6 month f/u anxiety and depression. Significant benefit with zoloft, she does feel like she would benefit from an increased dose.  Denies SI/HI, sleep, appetite concerns, severe mood swings, frequent panic episodes. No new concerns today, overall doing very well.      01/06/2022    1:16 PM 07/08/2021    3:30 PM 01/21/2021    3:34 PM  Depression screen PHQ 2/9  Decreased Interest 1 1 1   Down, Depressed, Hopeless 1 1 1   PHQ - 2 Score 2 2 2   Altered sleeping 2 1 1   Tired, decreased energy 2 3 1   Change in appetite 1 1 1   Feeling bad or failure about yourself  0 1 0  Trouble concentrating 2 3 2   Moving slowly or fidgety/restless 1 2 1   Suicidal thoughts 0 0 0  PHQ-9 Score 10 13 8   Difficult doing work/chores Somewhat difficult Very difficult Somewhat difficult      01/06/2022    1:17 PM 07/08/2021    3:32 PM 01/21/2021    3:35 PM 02/27/2020    4:56 PM  GAD 7 : Generalized Anxiety Score  Nervous, Anxious, on Edge 2 3 2 3   Control/stop worrying 1 1 1 1   Worry too much - different things 1 1 1 1   Trouble relaxing 2 3 2 3   Restless 1 2 1  0  Easily annoyed or irritable 0 2 0 0  Afraid - awful might happen 0 1 1 0  Total GAD 7 Score 7 13 8 8   Anxiety Difficulty Somewhat difficult Somewhat difficult Somewhat difficult Somewhat difficult   Relevant past medical, surgical, family and social history reviewed and updated as indicated. Interim medical history since our last visit reviewed. Allergies and medications reviewed and updated.  Review of Systems  Psychiatric/Behavioral:  Positive for dysphoric mood. Negative for suicidal ideas. The patient  is nervous/anxious.     Per HPI unless specifically indicated above     Objective:    There were no vitals taken for this visit.  Wt Readings from Last 3 Encounters:  08/26/21 268 lb 6.4 oz (121.7 kg)  07/08/21 268 lb (121.6 kg)  01/21/21 266 lb 12.8 oz (121 kg)    Physical Exam Vitals and nursing note reviewed.  HENT:     Head: Normocephalic.     Right Ear: Hearing normal.     Left Ear: Hearing normal.     Nose: Nose normal.  Eyes:     Pupils: Pupils are equal, round, and reactive to light.  Pulmonary:     Effort: Pulmonary effort is normal. No respiratory distress.  Neurological:     Mental Status: She is alert.  Psychiatric:        Mood and Affect: Mood normal.        Behavior: Behavior normal.        Thought Content: Thought content normal.        Judgment: Judgment normal.     Results for orders placed or performed in visit on 07/08/21  RPR  Result Value Ref Range  RPR Ser Ql Non Reactive Non Reactive  QuantiFERON-TB Gold Plus  Result Value Ref Range   QuantiFERON Incubation Incubation performed.    QuantiFERON Criteria Comment    QuantiFERON TB1 Ag Value 0.00 IU/mL   QuantiFERON TB2 Ag Value 0.00 IU/mL   QuantiFERON Nil Value 0.00 IU/mL   QuantiFERON Mitogen Value >10.00 IU/mL   QuantiFERON-TB Gold Plus Negative Negative  B12  Result Value Ref Range   Vitamin B-12 229 (L) 232 - 1,245 pg/mL  Comp Met (CMET)  Result Value Ref Range   Glucose 104 (H) 70 - 99 mg/dL   BUN 11 6 - 20 mg/dL   Creatinine, Ser 0.67 0.57 - 1.00 mg/dL   eGFR 117 >59 mL/min/1.73   BUN/Creatinine Ratio 16 9 - 23   Sodium 142 134 - 144 mmol/L   Potassium 3.9 3.5 - 5.2 mmol/L   Chloride 105 96 - 106 mmol/L   CO2 24 20 - 29 mmol/L   Calcium 9.4 8.7 - 10.2 mg/dL   Total Protein 7.1 6.0 - 8.5 g/dL   Albumin 4.3 3.8 - 4.8 g/dL   Globulin, Total 2.8 1.5 - 4.5 g/dL   Albumin/Globulin Ratio 1.5 1.2 - 2.2   Bilirubin Total 0.2 0.0 - 1.2 mg/dL   Alkaline Phosphatase 62 44 - 121 IU/L    AST 19 0 - 40 IU/L   ALT 22 0 - 32 IU/L  CBC w/Diff  Result Value Ref Range   WBC 6.9 3.4 - 10.8 x10E3/uL   RBC 4.73 3.77 - 5.28 x10E6/uL   Hemoglobin 14.8 11.1 - 15.9 g/dL   Hematocrit 43.6 34.0 - 46.6 %   MCV 92 79 - 97 fL   MCH 31.3 26.6 - 33.0 pg   MCHC 33.9 31.5 - 35.7 g/dL   RDW 11.8 11.7 - 15.4 %   Platelets 322 150 - 450 x10E3/uL   Neutrophils 56 Not Estab. %   Lymphs 32 Not Estab. %   Monocytes 8 Not Estab. %   Eos 2 Not Estab. %   Basos 1 Not Estab. %   Neutrophils Absolute 3.9 1.4 - 7.0 x10E3/uL   Lymphocytes Absolute 2.2 0.7 - 3.1 x10E3/uL   Monocytes Absolute 0.6 0.1 - 0.9 x10E3/uL   EOS (ABSOLUTE) 0.2 0.0 - 0.4 x10E3/uL   Basophils Absolute 0.1 0.0 - 0.2 x10E3/uL   Immature Granulocytes 1 Not Estab. %   Immature Grans (Abs) 0.0 0.0 - 0.1 x10E3/uL  HIV antibody (with reflex)  Result Value Ref Range   HIV Screen 4th Generation wRfx Non Reactive Non Reactive      Assessment & Plan:   Problem List Items Addressed This Visit       Other   Depression, recurrent (HCC) - Primary    Chronic. Doing well with Zoloft but would like to increase the dose.  Will increase to Zoloft 131m daily.  Medication sent to the pharmacy. Will follow up in 1 month for reevaluation.  Call sooner if concerns arise.       Relevant Medications   sertraline (ZOLOFT) 50 MG tablet   sertraline (ZOLOFT) 100 MG tablet   Anxiety    Chronic. Doing well with Zoloft but would like to increase the dose.  Will increase to Zoloft 1597mdaily.  Medication sent to the pharmacy. Will follow up in 1 month for reevaluation.  Call sooner if concerns arise.       Relevant Medications   sertraline (ZOLOFT) 50 MG tablet   sertraline (ZOLOFT) 100 MG  tablet     Follow up plan: Return in about 1 month (around 02/05/2022) for Depression/Anxiety FU.   This visit was completed via MyChart due to the restrictions of the COVID-19 pandemic. All issues as above were discussed and addressed. Physical exam  was done as above through visual confirmation on MyChart. If it was felt that the patient should be evaluated in the office, they were directed there. The patient verbally consented to this visit. Location of the patient: Home Location of the provider: Office Those involved with this call:  Provider: Jon Billings, NP CMA: Valinda Hoar, [place] Desk/Registration: Lynnell Catalan This encounter was conducted via video.  I spent 30 dedicated to the care of this patient on the date of this encounter to include previsit review of symptoms, medication options, and appointment with neurologist, face to face time with the patient, and post visit ordering of testing.

## 2022-02-06 ENCOUNTER — Telehealth: Payer: Managed Care, Other (non HMO) | Admitting: Nurse Practitioner

## 2022-02-16 ENCOUNTER — Encounter: Payer: Self-pay | Admitting: Nurse Practitioner

## 2022-02-16 ENCOUNTER — Telehealth: Payer: Managed Care, Other (non HMO) | Admitting: Nurse Practitioner

## 2022-02-16 DIAGNOSIS — F339 Major depressive disorder, recurrent, unspecified: Secondary | ICD-10-CM

## 2022-02-16 DIAGNOSIS — F419 Anxiety disorder, unspecified: Secondary | ICD-10-CM | POA: Diagnosis not present

## 2022-02-16 MED ORDER — SERTRALINE HCL 100 MG PO TABS: 100.0000 mg | ORAL_TABLET | Freq: Every day | ORAL | 1 refills | Status: DC

## 2022-02-16 MED ORDER — SERTRALINE HCL 50 MG PO TABS: 50.0000 mg | ORAL_TABLET | Freq: Every day | ORAL | 1 refills | Status: DC

## 2022-02-16 NOTE — Assessment & Plan Note (Signed)
Chronic.  Controlled.  Continue with current medication regimen of Zoloft '150mg'$  daily.  Refills sent today.  Return to clinic in 6 months for reevaluation.  Call sooner if concerns arise.

## 2022-02-16 NOTE — Progress Notes (Signed)
Patient would like to schedule her own appointment online when she can take a look at her schedule

## 2022-02-16 NOTE — Progress Notes (Signed)
There were no vitals taken for this visit.   Subjective:    Patient ID: Ruth Bass, female    DOB: [birth year], 36 y.o.   MRN: [id number]  HPI: Ruth Bass is a 36 y.o. female  Chief Complaint  Patient presents with   Anxiety   Depression    1 month follow up    DEPRESSION/ANXIETY Patient states she feels like the Zoloft 165m is a good dose for her. Denies concerns at visit today.       01/06/2022    1:16 PM 07/08/2021    3:30 PM 01/21/2021    3:34 PM  Depression screen PHQ 2/9  Decreased Interest _0 Down, Depressed, Hopeless _1 PHQ - 2 Score _2 Altered sleeping _3 Tired, decreased energy _4 Change in appetite _5 Feeling bad or failure about yourself  0 1 0  Trouble concentrating _6 Moving slowly or fidgety/restless _7 Suicidal thoughts 0 0 0  PHQ-9 Score _8 Difficult doing work/chores Somewhat difficult Very difficult Somewhat difficult      01/06/2022    1:17 PM 07/08/2021    3:32 PM 01/21/2021    3:35 PM 02/27/2020    4:56 PM  GAD 7 : Generalized Anxiety Score  Nervous, Anxious, on Edge _9 Control/stop worrying _10 Worry too much - different things _11 Trouble relaxing _12 Restless _13 0  Easily annoyed or irritable 0 2 0 0  Afraid - awful might happen 0 1 1 0  Total GAD 7 Score _14 Anxiety Difficulty Somewhat difficult Somewhat difficult Somewhat difficult Somewhat difficult   Relevant past medical, surgical, family and social history reviewed and updated as indicated. Interim medical history since our last visit reviewed. Allergies and medications reviewed and updated.  Review of Systems  Psychiatric/Behavioral:  Positive for dysphoric mood. Negative for suicidal ideas. The patient is nervous/anxious.     Per HPI unless specifically indicated above     Objective:    There were no vitals taken for this visit.  Wt Readings from Last 3 Encounters:  08/26/21 268 lb 6.4 oz  (121.7 kg)  07/08/21 268 lb (121.6 kg)  01/21/21 266 lb 12.8 oz (121 kg)    Physical Exam Vitals and nursing note reviewed.  HENT:     Head: Normocephalic.     Right Ear: Hearing normal.     Left Ear: Hearing normal.     Nose: Nose normal.  Eyes:     Pupils: Pupils are equal, round, and reactive to light.  Pulmonary:     Effort: Pulmonary effort is normal. No respiratory distress.  Neurological:     Mental Status: She is alert.  Psychiatric:        Mood and Affect: Mood normal.        Behavior: Behavior normal.        Thought Content: Thought content normal.        Judgment: Judgment normal.     Results for orders placed or performed in visit on 07/08/21  RPR  Result Value Ref Range   RPR Ser Ql Non Reactive Non Reactive  QuantiFERON-TB Gold Plus  Result Value Ref Range   QuantiFERON Incubation Incubation performed.    QuantiFERON Criteria Comment  QuantiFERON TB1 Ag Value 0.00 IU/mL   QuantiFERON TB2 Ag Value 0.00 IU/mL   QuantiFERON Nil Value 0.00 IU/mL   QuantiFERON Mitogen Value >10.00 IU/mL   QuantiFERON-TB Gold Plus Negative Negative  B12  Result Value Ref Range   Vitamin B-12 229 (L) 232 - 1,245 pg/mL  Comp Met (CMET)  Result Value Ref Range   Glucose 104 (H) 70 - 99 mg/dL   BUN 11 6 - 20 mg/dL   Creatinine, Ser 0.67 0.57 - 1.00 mg/dL   eGFR 117 >59 mL/min/1.73   BUN/Creatinine Ratio 16 9 - 23   Sodium 142 134 - 144 mmol/L   Potassium 3.9 3.5 - 5.2 mmol/L   Chloride 105 96 - 106 mmol/L   CO2 24 20 - 29 mmol/L   Calcium 9.4 8.7 - 10.2 mg/dL   Total Protein 7.1 6.0 - 8.5 g/dL   Albumin 4.3 3.8 - 4.8 g/dL   Globulin, Total 2.8 1.5 - 4.5 g/dL   Albumin/Globulin Ratio 1.5 1.2 - 2.2   Bilirubin Total 0.2 0.0 - 1.2 mg/dL   Alkaline Phosphatase 62 44 - 121 IU/L   AST 19 0 - 40 IU/L   ALT 22 0 - 32 IU/L  CBC w/Diff  Result Value Ref Range   WBC 6.9 3.4 - 10.8 x10E3/uL   RBC 4.73 3.77 - 5.28 x10E6/uL   Hemoglobin 14.8 11.1 - 15.9 g/dL   Hematocrit 43.6  34.0 - 46.6 %   MCV 92 79 - 97 fL   MCH 31.3 26.6 - 33.0 pg   MCHC 33.9 31.5 - 35.7 g/dL   RDW 11.8 11.7 - 15.4 %   Platelets 322 150 - 450 x10E3/uL   Neutrophils 56 Not Estab. %   Lymphs 32 Not Estab. %   Monocytes 8 Not Estab. %   Eos 2 Not Estab. %   Basos 1 Not Estab. %   Neutrophils Absolute 3.9 1.4 - 7.0 x10E3/uL   Lymphocytes Absolute 2.2 0.7 - 3.1 x10E3/uL   Monocytes Absolute 0.6 0.1 - 0.9 x10E3/uL   EOS (ABSOLUTE) 0.2 0.0 - 0.4 x10E3/uL   Basophils Absolute 0.1 0.0 - 0.2 x10E3/uL   Immature Granulocytes 1 Not Estab. %   Immature Grans (Abs) 0.0 0.0 - 0.1 x10E3/uL  HIV antibody (with reflex)  Result Value Ref Range   HIV Screen 4th Generation wRfx Non Reactive Non Reactive      Assessment & Plan:   Problem List Items Addressed This Visit       Other   Depression, recurrent (HCC)    Chronic.  Controlled.  Continue with current medication regimen of Zoloft 1103m daily.  Refills sent today.  Return to clinic in 6 months for reevaluation.  Call sooner if concerns arise.        Relevant Medications   sertraline (ZOLOFT) 100 MG tablet   sertraline (ZOLOFT) 50 MG tablet   Anxiety - Primary    Chronic.  Controlled.  Continue with current medication regimen of Zoloft 1567mdaily.  Refills sent today.  Return to clinic in 6 months for reevaluation.  Call sooner if concerns arise.       Relevant Medications   sertraline (ZOLOFT) 100 MG tablet   sertraline (ZOLOFT) 50 MG tablet     Follow up plan: Return in about 6 months (around 08/19/2022) for Depression/Anxiety FU.   This visit was completed via MyChart due to the restrictions of the COVID-19 pandemic. All issues as above were discussed and addressed. Physical exam  was done as above through visual confirmation on MyChart. If it was felt that the patient should be evaluated in the office, they were directed there. The patient verbally consented to this visit. Location of the patient: Home Location of the provider:  Office Those involved with this call:  Provider: Jon Billings, NP CMA: Valinda Hoar, [place] Desk/Registration: Lynnell Catalan This encounter was conducted via video.  I spent 20 dedicated to the care of this patient on the date of this encounter to include previsit review of symptoms, medication options, and appointment with neurologist, face to face time with the patient, and post visit ordering of testing.

## 2022-02-28 ENCOUNTER — Encounter: Payer: Self-pay | Admitting: Diagnostic Neuroimaging

## 2022-02-28 ENCOUNTER — Telehealth (INDEPENDENT_AMBULATORY_CARE_PROVIDER_SITE_OTHER): Payer: Managed Care, Other (non HMO) | Admitting: Diagnostic Neuroimaging

## 2022-02-28 DIAGNOSIS — R4689 Other symptoms and signs involving appearance and behavior: Secondary | ICD-10-CM

## 2022-02-28 DIAGNOSIS — G43111 Migraine with aura, intractable, with status migrainosus: Secondary | ICD-10-CM

## 2022-02-28 MED ORDER — TOPIRAMATE 50 MG PO TABS
50.0000 mg | ORAL_TABLET | Freq: Two times a day (BID) | ORAL | 4 refills | Status: AC
Start: 2022-02-28 — End: ?

## 2022-02-28 NOTE — Progress Notes (Signed)
    Virtual Visit via Video Note  I connected with Ruth Bass on 02/28/22 at  1:30 PM EDT by a video enabled telemedicine application and verified that I am speaking with the correct person using two identifiers.   I discussed the limitations of evaluation and management by telemedicine and the availability of in person appointments. The patient expressed understanding and agreed to proceed.  Patient is at their home. I am at the office.    History of Present Illness:  UPDATE (02/28/22, VRP): Since last visit, doing better on TPX. Less spells. 1x per week, milder events. Tolerating TPX '50mg'$  twice a day. Mild grogginess with TPX.   PRIOR HPI: 36 year old female here for evaluation of abnormal spells.   Around 2019 patient had onset of right frontal pain, shocklike sensations lasting 30 seconds at a time.  Oftentimes these are associated with perceived smell of smoke, metallic taste in the mouth, hearing classical music.  Sensory hallucinations can last several minutes for several hours.  Sometimes patient had confusion after these episodes.  Patient was evaluated for seizures in the past with MRI and EEG which have been unremarkable.  Patient continues to have his episodes several times a week.   Some episodes have been associated with right leg pain, spasms and weakness.  Patient has had neurological shocklike sensations down her spine.   Patient also was having some intermittent headaches.  Frontal pain, throbbing sensation, dizziness, nausea, sensitive to light and sound, tingling sensation on the back of her neck and scalp.  These happen 2-3 times a week.  Patient has tried nortriptyline, gabapentin, Maxalt in the past without relief.     Patient has family history of migraine in brother and sister.  No family history of seizure.   Patient has had some issues with depression in the past under good control with medication currently.  Has had some evaluation for fibromyalgia in the past  as well.    Observations/Objective:  Video exam.   Assessment and Plan:  Dx:  1. Spell of abnormal behavior   2. Intractable migraine with aura with status migrainosus      ABNORMAL SPELLS (right frontal HA, olfactory and auditory aura, confusion; 30-60 seconds; multiple per day; few times per week) - suspicious for complex partial / temporal lobe seizures vs complicated migraine; slightly improved with TPX    MIGRAINE WITH AURA - ibuprofen, tylenol as needed for breakthrough headaches - increase TPX up to 50 / 100; then up to '100mg'$  twice a days as tolerated to improve symptoms    Follow Up Instructions:  - Return in about 6 months (around 08/31/2022).    I discussed the assessment and treatment plan with the patient. The patient was provided an opportunity to ask questions and all were answered. The patient agreed with the plan and demonstrated an understanding of the instructions.   The patient was advised to call back or seek an in-person evaluation if the symptoms worsen or if the condition fails to improve as anticipated.  I provided 15 minutes of non-face-to-face time during this encounter.   Penni Bombard, MD 7/41/6384, 5:36 PM Certified in Neurology, Neurophysiology and Neuroimaging  [facility] [address], [place], [place] [address] [phone]

## 2022-02-28 NOTE — Patient Instructions (Signed)
ABNORMAL SPELLS (right frontal HA, olfactory and auditory aura, confusion; 30-60 seconds; multiple per day; few times per week) - suspicious for complex partial / temporal lobe seizures vs complicated migraine; slightly improved with topiramate    MIGRAINE WITH AURA - ibuprofen, tylenol as needed for breakthrough headaches - increase topiramate up to 50 / 100; then up to '100mg'$  twice a days as tolerated to improve symptoms

## 2022-06-21 ENCOUNTER — Other Ambulatory Visit (HOSPITAL_COMMUNITY)
Admission: RE | Admit: 2022-06-21 | Discharge: 2022-06-21 | Disposition: A | Discharge status: Home / Self Care | Payer: Managed Care, Other (non HMO) | Source: Ambulatory Visit | Attending: Nurse Practitioner | Admitting: Nurse Practitioner

## 2022-06-21 ENCOUNTER — Ambulatory Visit (INDEPENDENT_AMBULATORY_CARE_PROVIDER_SITE_OTHER): Payer: Managed Care, Other (non HMO) | Admitting: Nurse Practitioner

## 2022-06-21 ENCOUNTER — Encounter: Payer: Self-pay | Admitting: Nurse Practitioner

## 2022-06-21 VITALS — BP 131/79 | HR 76 | Temp 98.1°F | Ht 66.5 in | Wt 268.5 lb

## 2022-06-21 DIAGNOSIS — R7309 Other abnormal glucose: Secondary | ICD-10-CM

## 2022-06-21 DIAGNOSIS — Z136 Encounter for screening for cardiovascular disorders: Secondary | ICD-10-CM | POA: Diagnosis not present

## 2022-06-21 DIAGNOSIS — F419 Anxiety disorder, unspecified: Secondary | ICD-10-CM

## 2022-06-21 DIAGNOSIS — F339 Major depressive disorder, recurrent, unspecified: Secondary | ICD-10-CM

## 2022-06-21 DIAGNOSIS — Z0000 Encounter for general adult medical examination without abnormal findings: Secondary | ICD-10-CM | POA: Diagnosis present

## 2022-06-21 MED ORDER — SERTRALINE HCL 50 MG PO TABS: 50.0000 mg | ORAL_TABLET | Freq: Every day | ORAL | 1 refills | Status: DC

## 2022-06-21 MED ORDER — SERTRALINE HCL 100 MG PO TABS: 100.0000 mg | ORAL_TABLET | Freq: Every day | ORAL | 1 refills | Status: DC

## 2022-06-21 NOTE — Progress Notes (Unsigned)
BP 131/79 (BP Location: Left Arm, Cuff Size: Normal)   Pulse 76   Temp 98.1 F (36.7 C) (Oral)   Ht 5' 6.5" (1.689 m)   Wt 268 lb 8 oz (121.8 kg)   LMP 05/23/2022 (Approximate)   SpO2 98%   BMI 42.69 kg/m    Subjective:    Patient ID: Ruth Bass, female    DOB: September 03, 1985, 36 y.o.   MRN: [id number]  HPI: Ruth Bass is a 36 y.o. female presenting on 06/21/2022 for comprehensive medical examination. Current medical complaints include:none  She currently lives with: Menopausal Symptoms: no  DEPRESSION/ANXIETY Patient states it is stable.  She states things aren't perfect but she can deal with them.    Denies SI.  Depression Screen done today and results listed below:     06/21/2022    4:06 PM 01/06/2022    1:16 PM 07/08/2021    3:30 PM 01/21/2021    3:34 PM 02/27/2020    4:54 PM  Depression screen PHQ 2/9  Decreased Interest _0 Down, Depressed, Hopeless _1 PHQ - 2 Score _2 Altered sleeping _3 Tired, decreased energy _4 Change in appetite _5 0  Feeling bad or failure about yourself  0 0 1 0 0  Trouble concentrating _6 Moving slowly or fidgety/restless _7 Suicidal thoughts 0 0 0 0 0  PHQ-9 Score _8 Difficult doing work/chores Somewhat difficult Somewhat difficult Very difficult Somewhat difficult       06/21/2022    4:06 PM 01/06/2022    1:17 PM 07/08/2021    3:32 PM 01/21/2021    3:35 PM  GAD 7 : Generalized Anxiety Score  Nervous, Anxious, on Edge 0 _9 Control/stop worrying 0 _10 Worry too much - different things 0 _11 Trouble relaxing _12 Restless _13 Easily annoyed or irritable 0 0 2 0  Afraid - awful might happen 0 0 1 1  Total GAD 7 Score _14 Anxiety Difficulty Somewhat difficult Somewhat difficult Somewhat difficult Somewhat difficult     Denies HA, CP, SOB, dizziness, palpitations, visual changes, and lower extremity swelling.   The patient  has a history of falls. I did complete a risk assessment for falls. A plan of care for falls was documented.   Past Medical History:  Past Medical History:  Diagnosis Date   Amenorrhea    Since child, periods severely irregular. Over time periods stopped.   Anxiety    Depression    Migraine    PCOS (polycystic ovarian syndrome)    Seizures ([place])     Surgical History:  History reviewed. No pertinent surgical history.  Medications:  Current Outpatient Medications on File Prior to Visit  Medication Sig   cyclobenzaprine (FLEXERIL) 10 MG tablet Take 1 tablet (10 mg total) by mouth 3 (three) times daily as needed for muscle spasms.   meclizine (ANTIVERT) 25 MG tablet Take 1 tablet (25 mg total) by mouth 3 (three) times daily as needed for dizziness.   Multiple Vitamin (MULTIVITAMIN) capsule Take 1 capsule by mouth daily.   topiramate (TOPAMAX) 50 MG tablet Take 1-2 tablets (50-100 mg total) by mouth 2 (two) times daily.  No current facility-administered medications on file prior to visit.    Allergies:  Allergies  Allergen Reactions   Duloxetine Other (See Comments)    Seizure-like activity    Social History:  Social History   Socioeconomic History   Marital status: Married    Spouse name: Kathie Rhodes   Number of children: 0   Years of education: some college   Highest education level: Not on file  Occupational History   Occupation: homemaker  Tobacco Use   Smoking status: Never   Smokeless tobacco: Never  Vaping Use   Vaping Use: Never used  Substance and Sexual Activity   Alcohol use: Not Currently   Drug use: Never   Sexual activity: Yes    Partners: Male    Birth control/protection: None  Other Topics Concern   Not on file  Social History Narrative   Lives at home with husband.   Right-handed.   Caffeine use: 2 cups per day.    Social Determinants of Health   Financial Resource Strain: Not on file  Food Insecurity: Not on file  Transportation  Needs: Not on file  Physical Activity: Not on file  Stress: Not on file  Social Connections: Not on file  Intimate Partner Violence: Not on file   Social History   Tobacco Use  Smoking Status Never  Smokeless Tobacco Never   Social History   Substance and Sexual Activity  Alcohol Use Not Currently    Family History:  Family History  Problem Relation Age of Onset   Hypertension Mother    Cancer Father    Diabetes Father    Heart disease Father    Pancreatitis Father    Mental illness Sister    Heart disease Paternal Aunt    Cancer Paternal Aunt        Breast   Diabetes Paternal Uncle    Cancer Maternal Grandmother        Brain Tumor and Colon Cancer   Stroke Maternal Grandmother    Stroke Maternal Grandfather    Irritable bowel syndrome Cousin     Past medical history, surgical history, medications, allergies, family history and social history reviewed with patient today and changes made to appropriate areas of the chart.   Review of Systems  Eyes:  Negative for blurred vision and double vision.  Respiratory:  Negative for shortness of breath.   Cardiovascular:  Negative for chest pain, palpitations and leg swelling.  Neurological:  Negative for dizziness and headaches.  Psychiatric/Behavioral:  Positive for depression. Negative for suicidal ideas. The patient is nervous/anxious.    All other ROS negative except what is listed above and in the HPI.      Objective:    BP 131/79 (BP Location: Left Arm, Cuff Size: Normal)   Pulse 76   Temp 98.1 F (36.7 C) (Oral)   Ht 5' 6.5" (1.689 m)   Wt 268 lb 8 oz (121.8 kg)   LMP 05/23/2022 (Approximate)   SpO2 98%   BMI 42.69 kg/m   Wt Readings from Last 3 Encounters:  06/21/22 268 lb 8 oz (121.8 kg)  08/26/21 268 lb 6.4 oz (121.7 kg)  07/08/21 268 lb (121.6 kg)    Physical Exam Vitals and nursing note reviewed. Exam conducted with a chaperone present Yvonna Alanis, [place]).  Constitutional:      General: She  is awake. She is not in acute distress.    Appearance: Normal appearance. She is well-developed. She is not ill-appearing.  HENT:  Head: Normocephalic and atraumatic.     Right Ear: Hearing, tympanic membrane, ear canal and external ear normal. No drainage.     Left Ear: Hearing, tympanic membrane, ear canal and external ear normal. No drainage.     Nose: Nose normal.     Right Sinus: No maxillary sinus tenderness or frontal sinus tenderness.     Left Sinus: No maxillary sinus tenderness or frontal sinus tenderness.     Mouth/Throat:     Mouth: Mucous membranes are moist.     Pharynx: Oropharynx is clear. Uvula midline. No pharyngeal swelling, oropharyngeal exudate or posterior oropharyngeal erythema.  Eyes:     General: Lids are normal.        Right eye: No discharge.        Left eye: No discharge.     Extraocular Movements: Extraocular movements intact.     Conjunctiva/sclera: Conjunctivae normal.     Pupils: Pupils are equal, round, and reactive to light.     Visual Fields: Right eye visual fields normal and left eye visual fields normal.  Neck:     Thyroid: No thyromegaly.     Vascular: No carotid bruit.     Trachea: Trachea normal.  Cardiovascular:     Rate and Rhythm: Normal rate and regular rhythm.     Heart sounds: Normal heart sounds. No murmur heard.    No gallop.  Pulmonary:     Effort: Pulmonary effort is normal. No accessory muscle usage or respiratory distress.     Breath sounds: Normal breath sounds.  Chest:  Breasts:    Right: Normal.     Left: Normal.  Abdominal:     General: Bowel sounds are normal.     Palpations: Abdomen is soft. There is no hepatomegaly or splenomegaly.     Tenderness: There is no abdominal tenderness.  Genitourinary:    Vagina: Normal.     Cervix: Normal.  Musculoskeletal:        General: Normal range of motion.     Cervical back: Normal range of motion and neck supple.     Right lower leg: No edema.     Left lower leg: No edema.   Lymphadenopathy:     Head:     Right side of head: No submental, submandibular, tonsillar, preauricular or posterior auricular adenopathy.     Left side of head: No submental, submandibular, tonsillar, preauricular or posterior auricular adenopathy.     Cervical: No cervical adenopathy.     Upper Body:     Right upper body: No supraclavicular, axillary or pectoral adenopathy.     Left upper body: No supraclavicular, axillary or pectoral adenopathy.  Skin:    General: Skin is warm and dry.     Capillary Refill: Capillary refill takes less than 2 seconds.     Findings: No rash.  Neurological:     Mental Status: She is alert and oriented to person, place, and time.     Gait: Gait is intact.     Deep Tendon Reflexes: Reflexes are normal and symmetric.     Reflex Scores:      Brachioradialis reflexes are 2+ on the right side and 2+ on the left side.      Patellar reflexes are 2+ on the right side and 2+ on the left side. Psychiatric:        Attention and Perception: Attention normal.        Mood and Affect: Mood normal.  Speech: Speech normal.        Behavior: Behavior normal. Behavior is cooperative.        Thought Content: Thought content normal.        Judgment: Judgment normal.     Results for orders placed or performed in visit on 07/08/21  RPR  Result Value Ref Range   RPR Ser Ql Non Reactive Non Reactive  QuantiFERON-TB Gold Plus  Result Value Ref Range   QuantiFERON Incubation Incubation performed.    QuantiFERON Criteria Comment    QuantiFERON TB1 Ag Value 0.00 IU/mL   QuantiFERON TB2 Ag Value 0.00 IU/mL   QuantiFERON Nil Value 0.00 IU/mL   QuantiFERON Mitogen Value >10.00 IU/mL   QuantiFERON-TB Gold Plus Negative Negative  B12  Result Value Ref Range   Vitamin B-12 229 (L) 232 - 1,245 pg/mL  Comp Met (CMET)  Result Value Ref Range   Glucose 104 (H) 70 - 99 mg/dL   BUN 11 6 - 20 mg/dL   Creatinine, Ser 0.67 0.57 - 1.00 mg/dL   eGFR 117 >59 mL/min/1.73    BUN/Creatinine Ratio 16 9 - 23   Sodium 142 134 - 144 mmol/L   Potassium 3.9 3.5 - 5.2 mmol/L   Chloride 105 96 - 106 mmol/L   CO2 24 20 - 29 mmol/L   Calcium 9.4 8.7 - 10.2 mg/dL   Total Protein 7.1 6.0 - 8.5 g/dL   Albumin 4.3 3.8 - 4.8 g/dL   Globulin, Total 2.8 1.5 - 4.5 g/dL   Albumin/Globulin Ratio 1.5 1.2 - 2.2   Bilirubin Total 0.2 0.0 - 1.2 mg/dL   Alkaline Phosphatase 62 44 - 121 IU/L   AST 19 0 - 40 IU/L   ALT 22 0 - 32 IU/L  CBC w/Diff  Result Value Ref Range   WBC 6.9 3.4 - 10.8 x10E3/uL   RBC 4.73 3.77 - 5.28 x10E6/uL   Hemoglobin 14.8 11.1 - 15.9 g/dL   Hematocrit 43.6 34.0 - 46.6 %   MCV 92 79 - 97 fL   MCH 31.3 26.6 - 33.0 pg   MCHC 33.9 31.5 - 35.7 g/dL   RDW 11.8 11.7 - 15.4 %   Platelets 322 150 - 450 x10E3/uL   Neutrophils 56 Not Estab. %   Lymphs 32 Not Estab. %   Monocytes 8 Not Estab. %   Eos 2 Not Estab. %   Basos 1 Not Estab. %   Neutrophils Absolute 3.9 1.4 - 7.0 x10E3/uL   Lymphocytes Absolute 2.2 0.7 - 3.1 x10E3/uL   Monocytes Absolute 0.6 0.1 - 0.9 x10E3/uL   EOS (ABSOLUTE) 0.2 0.0 - 0.4 x10E3/uL   Basophils Absolute 0.1 0.0 - 0.2 x10E3/uL   Immature Granulocytes 1 Not Estab. %   Immature Grans (Abs) 0.0 0.0 - 0.1 x10E3/uL  HIV antibody (with reflex)  Result Value Ref Range   HIV Screen 4th Generation wRfx Non Reactive Non Reactive      Assessment & Plan:   Problem List Items Addressed This Visit       Other   Depression, recurrent (HCC)     Chronic.  Controlled.  Continue with current medication regimen of Zoloft 147m daily.  Refills sent today.  Labs ordered today.  Return to clinic in 6 months for reevaluation.  Call sooner if concerns arise.       Relevant Medications   sertraline (ZOLOFT) 50 MG tablet   sertraline (ZOLOFT) 100 MG tablet   Anxiety    Chronic.  Controlled.  Continue with current medication regimen of Zoloft 115m daily.  Refills sent today.  Labs ordered today.  Return to clinic in 6 months for reevaluation.   Call sooner if concerns arise.      Relevant Medications   sertraline (ZOLOFT) 50 MG tablet   sertraline (ZOLOFT) 100 MG tablet   Other Visit Diagnoses     Annual physical exam    -  Primary   Health maintenance reviewed during visit today. Labs ordered.  Declined flu. PAP done.   Relevant Orders   CBC with Differential/Platelet   Comprehensive metabolic panel   Lipid panel   TSH   Urinalysis, Routine w reflex microscopic   Cytology - PAP   Screening for ischemic heart disease       Relevant Orders   Lipid panel        Follow up plan: Return in about 6 months (around 12/21/2022) for HTN, HLD, DM2 FU.   LABORATORY TESTING:  - Pap smear: done elsewhere  IMMUNIZATIONS:   - Tdap: Tetanus vaccination status reviewed: last tetanus booster within 10 years. - Influenza: Postponed to flu season - Pneumovax: Not applicable - Prevnar: Not applicable - HPV: Up to date - Zostavax vaccine: Not applicable  SCREENING: -Mammogram: Not applicable  - Colonoscopy: Not applicable  - Bone Density: Not applicable  -Hearing Test: Not applicable  -Spirometry: Not applicable   PATIENT COUNSELING:   Advised to take 1 mg of folate supplement per day if capable of pregnancy.   Sexuality: Discussed sexually transmitted diseases, partner selection, use of condoms, avoidance of unintended pregnancy  and contraceptive alternatives.   Advised to avoid cigarette smoking.  I discussed with the patient that most people either abstain from alcohol or drink within safe limits (<=14/week and <=4 drinks/occasion for males, <=7/weeks and <= 3 drinks/occasion for females) and that the risk for alcohol disorders and other health effects rises proportionally with the number of drinks per week and how often a drinker exceeds daily limits.  Discussed cessation/primary prevention of drug use and availability of treatment for abuse.   Diet: Encouraged to adjust caloric intake to maintain  or achieve ideal  body weight, to reduce intake of dietary saturated fat and total fat, to limit sodium intake by avoiding high sodium foods and not adding table salt, and to maintain adequate dietary potassium and calcium preferably from fresh fruits, vegetables, and low-fat dairy products.    stressed the importance of regular exercise  Injury prevention: Discussed safety belts, safety helmets, smoke detector, smoking near bedding or upholstery.   Dental health: Discussed importance of regular tooth brushing, flossing, and dental visits.    NEXT PREVENTATIVE PHYSICAL DUE IN 1 YEAR. Return in about 6 months (around 12/21/2022) for HTN, HLD, DM2 FU.

## 2022-06-21 NOTE — Assessment & Plan Note (Signed)
Chronic.  Controlled.  Continue with current medication regimen of Zoloft '150mg'$  daily.  Refills sent today.  Labs ordered today.  Return to clinic in 6 months for reevaluation.  Call sooner if concerns arise.

## 2022-06-22 LAB — URINALYSIS, ROUTINE W REFLEX MICROSCOPIC
Bilirubin, UA: NEGATIVE
Glucose, UA: NEGATIVE
Ketones, UA: NEGATIVE
Nitrite, UA: NEGATIVE
Protein,UA: NEGATIVE
RBC, UA: NEGATIVE
Specific Gravity, UA: 1.02 (ref 1.005–1.030)
Urobilinogen, Ur: 0.2 mg/dL (ref 0.2–1.0)
pH, UA: 5.5 (ref 5.0–7.5)

## 2022-06-22 LAB — CBC WITH DIFFERENTIAL/PLATELET
Basophils Absolute: 0 10*3/uL (ref 0.0–0.2)
Basos: 1 %
EOS (ABSOLUTE): 0.2 10*3/uL (ref 0.0–0.4)
Eos: 2 %
Hematocrit: 43.4 % (ref 34.0–46.6)
Hemoglobin: 14.7 g/dL (ref 11.1–15.9)
Immature Grans (Abs): 0 10*3/uL (ref 0.0–0.1)
Immature Granulocytes: 0 %
Lymphocytes Absolute: 2.3 10*3/uL (ref 0.7–3.1)
Lymphs: 36 %
MCH: 30.6 pg (ref 26.6–33.0)
MCHC: 33.9 g/dL (ref 31.5–35.7)
MCV: 90 fL (ref 79–97)
Monocytes Absolute: 0.4 10*3/uL (ref 0.1–0.9)
Monocytes: 7 %
Neutrophils Absolute: 3.3 10*3/uL (ref 1.4–7.0)
Neutrophils: 54 %
Platelets: 314 10*3/uL (ref 150–450)
RBC: 4.81 x10E6/uL (ref 3.77–5.28)
RDW: 12 % (ref 11.7–15.4)
WBC: 6.3 10*3/uL (ref 3.4–10.8)

## 2022-06-22 LAB — COMPREHENSIVE METABOLIC PANEL
ALT: 20 IU/L (ref 0–32)
AST: 18 IU/L (ref 0–40)
Albumin/Globulin Ratio: 1.9 (ref 1.2–2.2)
Albumin: 4.5 g/dL (ref 3.9–4.9)
Alkaline Phosphatase: 61 IU/L (ref 44–121)
BUN/Creatinine Ratio: 12 (ref 9–23)
BUN: 10 mg/dL (ref 6–20)
Bilirubin Total: 0.2 mg/dL (ref 0.0–1.2)
CO2: 21 mmol/L (ref 20–29)
Calcium: 9.6 mg/dL (ref 8.7–10.2)
Chloride: 108 mmol/L — ABNORMAL HIGH (ref 96–106)
Creatinine, Ser: 0.82 mg/dL (ref 0.57–1.00)
Globulin, Total: 2.4 g/dL (ref 1.5–4.5)
Glucose: 139 mg/dL — ABNORMAL HIGH (ref 70–99)
Potassium: 3.8 mmol/L (ref 3.5–5.2)
Sodium: 143 mmol/L (ref 134–144)
Total Protein: 6.9 g/dL (ref 6.0–8.5)
eGFR: 95 mL/min/{1.73_m2} (ref >59)

## 2022-06-22 LAB — LIPID PANEL
Chol/HDL Ratio: 4.9 ratio — ABNORMAL HIGH (ref 0.0–4.4)
Cholesterol, Total: 191 mg/dL (ref 100–199)
HDL: 39 mg/dL — ABNORMAL LOW (ref >39)
LDL Chol Calc (NIH): 125 mg/dL — ABNORMAL HIGH (ref 0–99)
Triglycerides: 148 mg/dL (ref 0–149)
VLDL Cholesterol Cal: 27 mg/dL (ref 5–40)

## 2022-06-22 LAB — TSH: TSH: 1.51 u[IU]/mL (ref 0.450–4.500)

## 2022-06-22 NOTE — Progress Notes (Signed)
Hi Ruth Bass. It was nice to see you yesterday.  Your lab work looks good.  Your glucose is elevated.  I have added on a measure of diabetes.  I will let you know what it says when it returns.  Your cholesterol is also elevated.  I recommend following a low fat diet and exercising.  No other concerns at this time. Continue with your current medication regimen.  Follow up as discussed.  Please let me know if you have any questions.

## 2022-06-23 LAB — SPECIMEN STATUS REPORT

## 2022-06-23 LAB — HEMOGLOBIN A1C
Est. average glucose Bld gHb Est-mCnc: 114 mg/dL
Hgb A1c MFr Bld: 5.6 % (ref 4.8–5.6)

## 2022-06-23 NOTE — Progress Notes (Signed)
Hi Tempestt.  No evidence of diabetes.  We will keep an eye on this at future visits.  Let me know if you have any questions.

## 2022-06-27 LAB — CYTOLOGY - PAP
Comment: NEGATIVE
Diagnosis: NEGATIVE
High risk HPV: NEGATIVE

## 2022-06-27 NOTE — Progress Notes (Signed)
Hi Akeya. Your PAP was normal.  We will repeat it in 5 years.

## 2023-01-02 ENCOUNTER — Other Ambulatory Visit: Payer: Self-pay | Admitting: Nurse Practitioner

## 2023-01-03 NOTE — Telephone Encounter (Signed)
Requested Prescriptions  Pending Prescriptions Disp Refills   sertraline (ZOLOFT) 50 MG tablet [Pharmacy Med Name: Sertraline HCl 50 MG Oral Tablet] 90 tablet 0    Sig: TAKE 1 TABLET BY MOUTH DAILY     Psychiatry:  Antidepressants - SSRI - sertraline Failed - 01/02/2023 10:05 PM      Failed - Valid encounter within last 6 months    Recent Outpatient Visits           6 months ago Annual physical exam   [place] [facility] Larae Grooms, NP   10 months ago Anxiety   [place] [facility] Larae Grooms, NP   12 months ago Depression, recurrent [facility])   [place] [facility] Larae Grooms, NP   1 year ago Chronic nonintractable headache, unspecified headache type   [place] [facility] Larae Grooms, NP   1 year ago Annual physical exam   [place] [facility] Larae Grooms, NP              Passed - AST in normal range and within 360 days    AST  Date Value Ref Range Status  06/21/2022 18 0 - 40 IU/L Final         Passed - ALT in normal range and within 360 days    ALT  Date Value Ref Range Status  06/21/2022 20 0 - 32 IU/L Final         Passed - Completed PHQ-2 or PHQ-9 in the last 360 days       sertraline (ZOLOFT) 100 MG tablet [Pharmacy Med Name: Sertraline HCl 100 MG Oral Tablet] 90 tablet 0    Sig: TAKE 1 TABLET BY MOUTH DAILY TO  BE TAKEN WITH 50 MG TABLET TO  MAKE 150 MG     Psychiatry:  Antidepressants - SSRI - sertraline Failed - 01/02/2023 10:05 PM      Failed - Valid encounter within last 6 months    Recent Outpatient Visits           6 months ago Annual physical exam   [place] [facility] Larae Grooms, NP   10 months ago Anxiety   [place] [facility] Larae Grooms, NP   12 months ago Depression, recurrent [facility])   [place] [facility] Larae Grooms, NP   1 year ago Chronic nonintractable headache,  unspecified headache type   [place] [facility] Larae Grooms, NP   1 year ago Annual physical exam   [place] [facility] Larae Grooms, NP              Passed - AST in normal range and within 360 days    AST  Date Value Ref Range Status  06/21/2022 18 0 - 40 IU/L Final         Passed - ALT in normal range and within 360 days    ALT  Date Value Ref Range Status  06/21/2022 20 0 - 32 IU/L Final         Passed - Completed PHQ-2 or PHQ-9 in the last 360 days
